# Patient Record
Sex: Female | Born: 1980 | Race: White | Hispanic: No | Marital: Married | Smoking: Current every day smoker
Health system: Southern US, Community
[De-identification: ages and names within clinical notes are randomized; demographics above are authoritative.]

## PROBLEM LIST (undated history)

## (undated) DIAGNOSIS — E559 Vitamin D deficiency, unspecified: Secondary | ICD-10-CM

## (undated) DIAGNOSIS — S060XAA Concussion with loss of consciousness status unknown, initial encounter: Secondary | ICD-10-CM

## (undated) DIAGNOSIS — D259 Leiomyoma of uterus, unspecified: Secondary | ICD-10-CM

## (undated) DIAGNOSIS — G709 Myoneural disorder, unspecified: Secondary | ICD-10-CM

## (undated) DIAGNOSIS — K219 Gastro-esophageal reflux disease without esophagitis: Secondary | ICD-10-CM

## (undated) DIAGNOSIS — O139 Gestational [pregnancy-induced] hypertension without significant proteinuria, unspecified trimester: Secondary | ICD-10-CM

## (undated) DIAGNOSIS — IMO0002 Reserved for concepts with insufficient information to code with codable children: Secondary | ICD-10-CM

## (undated) DIAGNOSIS — M1990 Unspecified osteoarthritis, unspecified site: Secondary | ICD-10-CM

## (undated) DIAGNOSIS — G43909 Migraine, unspecified, not intractable, without status migrainosus: Secondary | ICD-10-CM

## (undated) DIAGNOSIS — E039 Hypothyroidism, unspecified: Secondary | ICD-10-CM

## (undated) HISTORY — PX: APPENDECTOMY: SHX54

## (undated) HISTORY — PX: WISDOM TOOTH EXTRACTION: SHX21

---

## 2001-05-30 ENCOUNTER — Other Ambulatory Visit: Admission: RE | Admit: 2001-05-30 | Discharge: 2001-05-30 | Payer: Self-pay | Admitting: *Deleted

## 2002-06-16 ENCOUNTER — Other Ambulatory Visit: Admission: RE | Admit: 2002-06-16 | Discharge: 2002-06-16 | Payer: Self-pay | Admitting: *Deleted

## 2003-06-22 ENCOUNTER — Other Ambulatory Visit: Admission: RE | Admit: 2003-06-22 | Discharge: 2003-06-22 | Payer: Self-pay | Admitting: *Deleted

## 2003-10-15 ENCOUNTER — Ambulatory Visit (HOSPITAL_COMMUNITY): Admission: RE | Admit: 2003-10-15 | Discharge: 2003-10-15 | Payer: Self-pay | Admitting: Internal Medicine

## 2004-01-17 ENCOUNTER — Encounter: Payer: Self-pay | Admitting: Orthopedic Surgery

## 2005-11-05 ENCOUNTER — Ambulatory Visit: Payer: Self-pay | Admitting: Orthopedic Surgery

## 2005-11-16 ENCOUNTER — Encounter: Payer: Self-pay | Admitting: Orthopedic Surgery

## 2005-11-21 ENCOUNTER — Ambulatory Visit: Payer: Self-pay | Admitting: Orthopedic Surgery

## 2005-12-06 ENCOUNTER — Encounter: Admission: RE | Admit: 2005-12-06 | Discharge: 2005-12-06 | Payer: Self-pay | Admitting: Orthopedic Surgery

## 2005-12-19 ENCOUNTER — Encounter: Admission: RE | Admit: 2005-12-19 | Discharge: 2005-12-19 | Payer: Self-pay | Admitting: Orthopedic Surgery

## 2006-09-09 ENCOUNTER — Ambulatory Visit: Payer: Self-pay | Admitting: Orthopedic Surgery

## 2009-08-29 ENCOUNTER — Ambulatory Visit: Payer: Self-pay | Admitting: Orthopedic Surgery

## 2009-08-29 DIAGNOSIS — M5430 Sciatica, unspecified side: Secondary | ICD-10-CM

## 2009-08-30 ENCOUNTER — Telehealth: Payer: Self-pay | Admitting: Orthopedic Surgery

## 2010-04-08 ENCOUNTER — Emergency Department (HOSPITAL_COMMUNITY): Admission: EM | Admit: 2010-04-08 | Discharge: 2010-04-09 | Payer: Self-pay | Admitting: Emergency Medicine

## 2010-09-17 ENCOUNTER — Encounter: Payer: Self-pay | Admitting: Orthopedic Surgery

## 2010-09-26 NOTE — Progress Notes (Signed)
Summary: MRI on hold.  Phone Note Outgoing Call   Call placed by: Waldon Reining,  August 30, 2009 10:43 AM Call placed to: Patient Action Taken: Phone Call Completed Summary of Call: I called to give patient her MRI appointment and she wanted to wait until she took her Dose pack to see if it helps. She is going to call back before the end of the month if she decides to have the MRI. Patient has [employer], authorization [phone] and it expires in 30 days.

## 2010-09-26 NOTE — Progress Notes (Signed)
Summary: Progress note  Progress note   Imported By: Jacklynn Ganong 08/29/2009 14:46:53  _____________________________________________________________________  External Attachment:    Type:   Image     Comment:   External Document

## 2010-09-26 NOTE — Assessment & Plan Note (Signed)
Summary: lbp needs xrays, esi in past wants new order for esi/bcbs.cbt   Visit Type:  Follow-up  CC:  back pain .  History of Present Illness: I saw Lisa Wolf in the office today for an initial visit.  She is a 30 years old woman with the complaint of:  BACK PAIN.  History of ESI's, which helped.  chief complaint: BACK PAIN.  30 year old female with history of lumbar disc disease, requiring epidural injections. Presents now with back pain for 3 weeks worse by moving bending improve with ibuprofen.  Over the last 3 weeks radiating pain in the RIGHT leg into the RIGHT thigh. Possibly pull the muscle.  note she has had 3 episodes of the back "going out on her" especially with twisting maneuvers and bending maneuvers  -severity [1-10] 8 at worse, today around 3 the pain is intermittent. There are no red flags.   x-rays done in 2007. 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 Had an epidural with good relief.  review of systems neuro negative review of systems musculoskeletal as stated. No muscle weakness, genitourinary, negative.  Physical examination well-developed, well-nourished, female, woman, and hygiene, are intact. Body habitus, thin muscular.  She is oriented x3.  Her mood and affect are normal.  Her gait and station are normal.  Her skin is normal. Over the lumbar area. She is normal pulse, and temperature, and both limbs with no edema, or swelling. Is no lymphadenopathy in either limb. Sensation is normal in each limb and reflexes are normal. At the knee and ankle of both sides, and they are equal, coordination and balance are normal.  She is having some mild tenderness in the midportion of the lumbar spine at the L5 area. She has good range of motion in the spine, flexion, extension.  Strength in her lower extremities normal. Hips are stable. Knees are stable.  Straight leg  raise is mildly positive on the RIGHT, negative on the LEFT.  Assessment recurrent back pain with radicular symptoms in the L3 region. Plan recommend MRI, oral dose pack, oral, Neurontin  Allergies (verified): 1)  ! Cipro  Past History:  Past Medical History: Migraines Back pain  Knee pain.  Review of Systems General:  Denies weight loss, weight gain, fever, chills, and fatigue. Cardiac :  Denies chest pain, angina, heart attack, heart failure, poor circulation, blood clots, and phlebitis. Resp:  Denies short of breath, difficulty breathing, COPD, cough, and pneumonia. GI:  Denies nausea, vomiting, diarrhea, constipation, difficulty swallowing, ulcers, GERD, and reflux. GU:  Denies kidney failure, kidney transplant, kidney stones, burning, poor stream, testicular cancer, blood in urine, and . Neuro:  Complains of migraines; denies headache, dizziness, numbness, weakness, tremor, and unsteady walking. MS:  Complains of joint pain; denies rheumatoid arthritis, joint swelling, gout, bone cancer, osteoporosis, and . Endo:  Denies thyroid disease, goiter, and diabetes. Psych:  Denies depression, mood swings, anxiety, panic attack, bipolar, and schizophrenia. Derm:  Denies eczema, cancer, and itching. EENT:  Denies poor vision, cataracts, glaucoma, poor hearing, vertigo, ears ringing, sinusitis, hoarseness, toothaches, and bleeding gums. Immunology:  Denies seasonal allergies, sinus problems, and allergic to bee stings. Lymphatic:  Denies lymph node cancer and lymph edema.   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 Focal disc protrusion at L4-L5, deviating and compressing the LEFT L5 nerve root in the  lateral recess facet arthritis as well. Moderate facet arthritic changes at L2 and 3, moderately advanced facet arthritis, and disc bulging at L4, and 3, advanced arthritis at L4, and 5 central  disc protrusion as well.  The patient improved no need to get epidural. If he worsens, she will call us, and we'll go ahead and schedule. Epidural.  Orders: Est. Patient Level III ([address])  Medications Added to Medication List This Visit: 1)  Neurontin 300 Mg Caps (Gabapentin) .... One by mouth hs 2)  Prednisone (pak) 10 Mg Tabs (Prednisone) .... As directed for 12 days  Patient Instructions: 1)  go for MRI we will call you and let you know when to go 2)  take meds as directed 3)  call us if your pain gets worse and we will send you for an ESI. 4)  We will call you with results. Prescriptions: PREDNISONE (PAK) 10 MG TABS (PREDNISONE) as directed for 12 days  #1 x 1   Entered and Authorized by:   Fuller Canada MD   Signed by:   Fuller Canada MD on 08/29/2009   Method used:   Print then Give to Patient   RxID:   [id number] NEURONTIN 300 MG CAPS (GABAPENTIN) one by mouth hs  #30 x 1   Entered and Authorized by:   Fuller Canada MD   Signed by:   Fuller Canada MD on 08/29/2009   Method used:   Print then Give to Patient   RxID:   [id number]

## 2010-09-26 NOTE — Progress Notes (Signed)
Summary: Initial evaluation  Initial evaluation   Imported By: Jacklynn Ganong 08/29/2009 14:47:50  _____________________________________________________________________  External Attachment:    Type:   Image     Comment:   External Document

## 2010-09-26 NOTE — Letter (Signed)
Summary: History form  History form   Imported By: Jacklynn Ganong 08/30/2009 14:37:25  _____________________________________________________________________  External Attachment:    Type:   Image     Comment:   External Document

## 2011-08-28 DIAGNOSIS — Z8759 Personal history of other complications of pregnancy, childbirth and the puerperium: Secondary | ICD-10-CM

## 2011-08-28 HISTORY — DX: Personal history of other complications of pregnancy, childbirth and the puerperium: Z87.59

## 2011-11-13 LAB — OB RESULTS CONSOLE RUBELLA ANTIBODY, IGM: Rubella: IMMUNE

## 2011-11-13 LAB — OB RESULTS CONSOLE HIV ANTIBODY (ROUTINE TESTING): HIV: NONREACTIVE

## 2011-11-27 LAB — OB RESULTS CONSOLE GC/CHLAMYDIA
Chlamydia: NEGATIVE
Gonorrhea: NEGATIVE

## 2012-05-23 LAB — OB RESULTS CONSOLE GBS: GBS: NEGATIVE

## 2012-06-24 ENCOUNTER — Encounter (HOSPITAL_COMMUNITY)
Admission: AD | Disposition: A | Discharge status: Home / Self Care | Payer: Self-pay | Source: Ambulatory Visit | Attending: Obstetrics and Gynecology

## 2012-06-24 ENCOUNTER — Inpatient Hospital Stay (HOSPITAL_COMMUNITY)
Admission: AD | Admit: 2012-06-24 | Discharge: 2012-06-27 | DRG: 370 | Disposition: A | Discharge status: Home / Self Care | Payer: BC Managed Care – PPO | Source: Ambulatory Visit | Attending: Obstetrics and Gynecology | Admitting: Obstetrics and Gynecology

## 2012-06-24 ENCOUNTER — Encounter (HOSPITAL_COMMUNITY): Payer: Self-pay | Admitting: Anesthesiology

## 2012-06-24 ENCOUNTER — Encounter (HOSPITAL_COMMUNITY): Payer: Self-pay | Admitting: *Deleted

## 2012-06-24 ENCOUNTER — Inpatient Hospital Stay (HOSPITAL_COMMUNITY): Payer: BC Managed Care – PPO | Admitting: Anesthesiology

## 2012-06-24 DIAGNOSIS — D62 Acute posthemorrhagic anemia: Secondary | ICD-10-CM | POA: Diagnosis not present

## 2012-06-24 DIAGNOSIS — O139 Gestational [pregnancy-induced] hypertension without significant proteinuria, unspecified trimester: Secondary | ICD-10-CM | POA: Diagnosis present

## 2012-06-24 DIAGNOSIS — O4100X0 Oligohydramnios, unspecified trimester, not applicable or unspecified: Secondary | ICD-10-CM | POA: Diagnosis present

## 2012-06-24 DIAGNOSIS — O9903 Anemia complicating the puerperium: Secondary | ICD-10-CM | POA: Diagnosis not present

## 2012-06-24 HISTORY — DX: Gestational (pregnancy-induced) hypertension without significant proteinuria, unspecified trimester: O13.9

## 2012-06-24 HISTORY — DX: Reserved for concepts with insufficient information to code with codable children: IMO0002

## 2012-06-24 LAB — COMPREHENSIVE METABOLIC PANEL
ALT: 13 U/L (ref 0–35)
AST: 16 U/L (ref 0–37)
Albumin: 2.5 g/dL — ABNORMAL LOW (ref 3.5–5.2)
Calcium: 8.9 mg/dL (ref 8.4–10.5)
Sodium: 133 mEq/L — ABNORMAL LOW (ref 135–145)
Total Protein: 6.1 g/dL (ref 6.0–8.3)

## 2012-06-24 LAB — RPR: RPR Ser Ql: NONREACTIVE

## 2012-06-24 LAB — CBC
Hemoglobin: 11.3 g/dL — ABNORMAL LOW (ref 12.0–15.0)
MCHC: 32.8 g/dL (ref 30.0–36.0)
Platelets: 279 10*3/uL (ref 150–400)
RDW: 13.7 % (ref 11.5–15.5)

## 2012-06-24 SURGERY — Surgical Case
Anesthesia: Spinal | Site: Abdomen | Wound class: Clean Contaminated

## 2012-06-24 MED ORDER — SCOPOLAMINE 1 MG/3DAYS TD PT72
MEDICATED_PATCH | TRANSDERMAL | Status: AC
  Filled 2012-06-24: qty 1

## 2012-06-24 MED ORDER — SODIUM CHLORIDE 0.9 % IJ SOLN: 3.0000 mL | INTRAMUSCULAR | Status: DC | PRN

## 2012-06-24 MED ORDER — LIDOCAINE HCL (PF) 1 % IJ SOLN: 30.0000 mL | INTRAMUSCULAR | Status: DC | PRN

## 2012-06-24 MED ORDER — MORPHINE SULFATE (PF) 0.5 MG/ML IJ SOLN
INTRAMUSCULAR | Status: DC | PRN
  Administered 2012-06-24: .1 ug via INTRATHECAL

## 2012-06-24 MED ORDER — EPHEDRINE 5 MG/ML INJ
INTRAVENOUS | Status: AC
  Filled 2012-06-24: qty 10

## 2012-06-24 MED ORDER — WITCH HAZEL-GLYCERIN EX PADS: 1.0000 "application " | MEDICATED_PAD | CUTANEOUS | Status: DC | PRN

## 2012-06-24 MED ORDER — BUPIVACAINE HCL (PF) 0.25 % IJ SOLN
INTRAMUSCULAR | Status: AC
  Filled 2012-06-24: qty 30

## 2012-06-24 MED ORDER — LANOLIN HYDROUS EX OINT: 1.0000 "application " | TOPICAL_OINTMENT | CUTANEOUS | Status: DC | PRN

## 2012-06-24 MED ORDER — KETOROLAC TROMETHAMINE 30 MG/ML IJ SOLN
30.0000 mg | Freq: Four times a day (QID) | INTRAMUSCULAR | Status: DC | PRN
  Administered 2012-06-25: 30 mg via INTRAVENOUS
  Filled 2012-06-24: qty 1

## 2012-06-24 MED ORDER — SIMETHICONE 80 MG PO CHEW
80.0000 mg | CHEWABLE_TABLET | Freq: Three times a day (TID) | ORAL | Status: DC
  Administered 2012-06-25 – 2012-06-27 (×9): 80 mg via ORAL

## 2012-06-24 MED ORDER — OXYTOCIN BOLUS FROM INFUSION
500.0000 mL | INTRAVENOUS | Status: DC
  Filled 2012-06-24 (×58): qty 500

## 2012-06-24 MED ORDER — KETOROLAC TROMETHAMINE 30 MG/ML IJ SOLN: 30.0000 mg | Freq: Four times a day (QID) | INTRAMUSCULAR | Status: DC | PRN

## 2012-06-24 MED ORDER — LACTATED RINGERS IV SOLN
INTRAVENOUS | Status: DC
  Administered 2012-06-24 (×2): 125 mL/h via INTRAVENOUS

## 2012-06-24 MED ORDER — METOCLOPRAMIDE HCL 5 MG/ML IJ SOLN: 10.0000 mg | Freq: Three times a day (TID) | INTRAMUSCULAR | Status: DC | PRN

## 2012-06-24 MED ORDER — OXYTOCIN 10 UNIT/ML IJ SOLN
40.0000 [IU] | INTRAVENOUS | Status: DC | PRN
  Administered 2012-06-24: 40 [IU] via INTRAVENOUS

## 2012-06-24 MED ORDER — SENNOSIDES-DOCUSATE SODIUM 8.6-50 MG PO TABS
2.0000 | ORAL_TABLET | Freq: Every day | ORAL | Status: DC
  Administered 2012-06-25 – 2012-06-26 (×2): 2 via ORAL

## 2012-06-24 MED ORDER — CEFAZOLIN SODIUM-DEXTROSE 2-3 GM-% IV SOLR
INTRAVENOUS | Status: DC | PRN
  Administered 2012-06-24: 2 g via INTRAVENOUS

## 2012-06-24 MED ORDER — ACETAMINOPHEN 325 MG PO TABS: 650.0000 mg | ORAL_TABLET | ORAL | Status: DC | PRN

## 2012-06-24 MED ORDER — OXYCODONE-ACETAMINOPHEN 5-325 MG PO TABS
1.0000 | ORAL_TABLET | ORAL | Status: DC | PRN
  Administered 2012-06-25 – 2012-06-27 (×10): 1 via ORAL
  Filled 2012-06-24 (×2): qty 1
  Filled 2012-06-24: qty 2
  Filled 2012-06-24 (×6): qty 1

## 2012-06-24 MED ORDER — ONDANSETRON HCL 4 MG/2ML IJ SOLN
INTRAMUSCULAR | Status: AC
  Filled 2012-06-24: qty 2

## 2012-06-24 MED ORDER — SODIUM CHLORIDE 0.9 % IV SOLN
1.0000 ug/kg/h | INTRAVENOUS | Status: DC | PRN
  Filled 2012-06-24: qty 2.5

## 2012-06-24 MED ORDER — MORPHINE SULFATE 0.5 MG/ML IJ SOLN
INTRAMUSCULAR | Status: AC
  Filled 2012-06-24: qty 10

## 2012-06-24 MED ORDER — FENTANYL CITRATE 0.05 MG/ML IJ SOLN
INTRAMUSCULAR | Status: DC | PRN
  Administered 2012-06-24: 15 ug via INTRATHECAL

## 2012-06-24 MED ORDER — BUPIVACAINE IN DEXTROSE 0.75-8.25 % IT SOLN
INTRATHECAL | Status: DC | PRN
  Administered 2012-06-24: 1.5 mL via INTRATHECAL

## 2012-06-24 MED ORDER — NALBUPHINE SYRINGE 5 MG/0.5 ML
5.0000 mg | INJECTION | INTRAMUSCULAR | Status: DC | PRN
  Administered 2012-06-24: 10 mg via SUBCUTANEOUS
  Filled 2012-06-24 (×2): qty 1

## 2012-06-24 MED ORDER — MENTHOL 3 MG MT LOZG: 1.0000 | LOZENGE | OROMUCOSAL | Status: DC | PRN

## 2012-06-24 MED ORDER — NALOXONE HCL 0.4 MG/ML IJ SOLN: 0.4000 mg | INTRAMUSCULAR | Status: DC | PRN

## 2012-06-24 MED ORDER — OXYTOCIN 40 UNITS IN LACTATED RINGERS INFUSION - SIMPLE MED: 62.5000 mL/h | INTRAVENOUS | Status: DC

## 2012-06-24 MED ORDER — TERBUTALINE SULFATE 1 MG/ML IJ SOLN: 0.2500 mg | Freq: Once | INTRAMUSCULAR | Status: DC | PRN

## 2012-06-24 MED ORDER — EPHEDRINE SULFATE 50 MG/ML IJ SOLN
INTRAMUSCULAR | Status: DC | PRN
  Administered 2012-06-24: 10 mg via INTRAVENOUS

## 2012-06-24 MED ORDER — IBUPROFEN 600 MG PO TABS: 600.0000 mg | ORAL_TABLET | Freq: Four times a day (QID) | ORAL | Status: DC

## 2012-06-24 MED ORDER — FENTANYL CITRATE 0.05 MG/ML IJ SOLN: 25.0000 ug | INTRAMUSCULAR | Status: DC | PRN

## 2012-06-24 MED ORDER — FLEET ENEMA 7-19 GM/118ML RE ENEM: 1.0000 | ENEMA | RECTAL | Status: DC | PRN

## 2012-06-24 MED ORDER — LACTATED RINGERS IV SOLN
INTRAVENOUS | Status: DC | PRN
  Administered 2012-06-24 (×2): via INTRAVENOUS

## 2012-06-24 MED ORDER — LACTATED RINGERS IV SOLN
INTRAVENOUS | Status: DC
  Administered 2012-06-25 (×2): via INTRAVENOUS

## 2012-06-24 MED ORDER — METHYLERGONOVINE MALEATE 0.2 MG PO TABS: 0.2000 mg | ORAL_TABLET | ORAL | Status: DC | PRN

## 2012-06-24 MED ORDER — BUPIVACAINE HCL (PF) 0.25 % IJ SOLN
INTRAMUSCULAR | Status: DC | PRN
  Administered 2012-06-24: 10 mL

## 2012-06-24 MED ORDER — ZOLPIDEM TARTRATE 5 MG PO TABS: 5.0000 mg | ORAL_TABLET | Freq: Every evening | ORAL | Status: DC | PRN

## 2012-06-24 MED ORDER — CITRIC ACID-SODIUM CITRATE 334-500 MG/5ML PO SOLN
30.0000 mL | ORAL | Status: DC | PRN
  Administered 2012-06-24: 30 mL via ORAL
  Filled 2012-06-24: qty 15

## 2012-06-24 MED ORDER — MISOPROSTOL 25 MCG QUARTER TABLET
25.0000 ug | ORAL_TABLET | ORAL | Status: DC | PRN
  Administered 2012-06-24 (×2): 25 ug via VAGINAL
  Filled 2012-06-24 (×2): qty 0.25

## 2012-06-24 MED ORDER — KETOROLAC TROMETHAMINE 60 MG/2ML IM SOLN
INTRAMUSCULAR | Status: AC
  Filled 2012-06-24: qty 2

## 2012-06-24 MED ORDER — ONDANSETRON HCL 4 MG/2ML IJ SOLN: 4.0000 mg | Freq: Four times a day (QID) | INTRAMUSCULAR | Status: DC | PRN

## 2012-06-24 MED ORDER — PRENATAL MULTIVITAMIN CH
1.0000 | ORAL_TABLET | Freq: Every day | ORAL | Status: DC
  Administered 2012-06-25 – 2012-06-27 (×3): 1 via ORAL
  Filled 2012-06-24 (×3): qty 1

## 2012-06-24 MED ORDER — ONDANSETRON HCL 4 MG/2ML IJ SOLN
INTRAMUSCULAR | Status: DC | PRN
  Administered 2012-06-24: 4 mg via INTRAVENOUS

## 2012-06-24 MED ORDER — FENTANYL CITRATE 0.05 MG/ML IJ SOLN
INTRAMUSCULAR | Status: AC
  Filled 2012-06-24: qty 2

## 2012-06-24 MED ORDER — IBUPROFEN 600 MG PO TABS
600.0000 mg | ORAL_TABLET | Freq: Four times a day (QID) | ORAL | Status: DC | PRN
  Administered 2012-06-25 – 2012-06-27 (×10): 600 mg via ORAL
  Filled 2012-06-24 (×10): qty 1

## 2012-06-24 MED ORDER — OXYCODONE-ACETAMINOPHEN 5-325 MG PO TABS: 1.0000 | ORAL_TABLET | ORAL | Status: DC | PRN

## 2012-06-24 MED ORDER — MEPERIDINE HCL 25 MG/ML IJ SOLN: 6.2500 mg | INTRAMUSCULAR | Status: DC | PRN

## 2012-06-24 MED ORDER — DIPHENHYDRAMINE HCL 50 MG/ML IJ SOLN: 12.5000 mg | INTRAMUSCULAR | Status: DC | PRN

## 2012-06-24 MED ORDER — ONDANSETRON HCL 4 MG PO TABS: 4.0000 mg | ORAL_TABLET | ORAL | Status: DC | PRN

## 2012-06-24 MED ORDER — KETOROLAC TROMETHAMINE 60 MG/2ML IM SOLN
60.0000 mg | Freq: Once | INTRAMUSCULAR | Status: AC | PRN
  Administered 2012-06-24: 60 mg via INTRAMUSCULAR

## 2012-06-24 MED ORDER — DIPHENHYDRAMINE HCL 50 MG/ML IJ SOLN: 25.0000 mg | INTRAMUSCULAR | Status: DC | PRN

## 2012-06-24 MED ORDER — DIBUCAINE 1 % RE OINT: 1.0000 "application " | TOPICAL_OINTMENT | RECTAL | Status: DC | PRN

## 2012-06-24 MED ORDER — CEFAZOLIN SODIUM-DEXTROSE 2-3 GM-% IV SOLR: 2.0000 g | INTRAVENOUS | Status: DC

## 2012-06-24 MED ORDER — LACTATED RINGERS IV SOLN
INTRAVENOUS | Status: DC | PRN
  Administered 2012-06-24: 19:00:00 via INTRAVENOUS

## 2012-06-24 MED ORDER — ONDANSETRON HCL 4 MG/2ML IJ SOLN: 4.0000 mg | Freq: Three times a day (TID) | INTRAMUSCULAR | Status: DC | PRN

## 2012-06-24 MED ORDER — CEFAZOLIN SODIUM-DEXTROSE 2-3 GM-% IV SOLR
INTRAVENOUS | Status: AC
  Filled 2012-06-24: qty 50

## 2012-06-24 MED ORDER — DIPHENHYDRAMINE HCL 25 MG PO CAPS: 25.0000 mg | ORAL_CAPSULE | Freq: Four times a day (QID) | ORAL | Status: DC | PRN

## 2012-06-24 MED ORDER — NALBUPHINE SYRINGE 5 MG/0.5 ML
5.0000 mg | INJECTION | INTRAMUSCULAR | Status: DC | PRN
  Filled 2012-06-24: qty 1

## 2012-06-24 MED ORDER — SIMETHICONE 80 MG PO CHEW: 80.0000 mg | CHEWABLE_TABLET | ORAL | Status: DC | PRN

## 2012-06-24 MED ORDER — OXYTOCIN 40 UNITS IN LACTATED RINGERS INFUSION - SIMPLE MED: 62.5000 mL/h | INTRAVENOUS | Status: AC

## 2012-06-24 MED ORDER — TETANUS-DIPHTH-ACELL PERTUSSIS 5-2.5-18.5 LF-MCG/0.5 IM SUSP
0.5000 mL | Freq: Once | INTRAMUSCULAR | Status: AC
  Administered 2012-06-25: 0.5 mL via INTRAMUSCULAR
  Filled 2012-06-24: qty 0.5

## 2012-06-24 MED ORDER — LACTATED RINGERS IV SOLN
500.0000 mL | INTRAVENOUS | Status: DC | PRN
  Administered 2012-06-24: 500 mL via INTRAVENOUS

## 2012-06-24 MED ORDER — METHYLERGONOVINE MALEATE 0.2 MG/ML IJ SOLN: 0.2000 mg | INTRAMUSCULAR | Status: DC | PRN

## 2012-06-24 MED ORDER — ONDANSETRON HCL 4 MG/2ML IJ SOLN: 4.0000 mg | INTRAMUSCULAR | Status: DC | PRN

## 2012-06-24 MED ORDER — DIPHENHYDRAMINE HCL 25 MG PO CAPS: 25.0000 mg | ORAL_CAPSULE | ORAL | Status: DC | PRN

## 2012-06-24 MED ORDER — SCOPOLAMINE 1 MG/3DAYS TD PT72
1.0000 | MEDICATED_PATCH | Freq: Once | TRANSDERMAL | Status: AC
  Administered 2012-06-24: 1.5 mg via TRANSDERMAL

## 2012-06-24 MED ORDER — IBUPROFEN 600 MG PO TABS: 600.0000 mg | ORAL_TABLET | Freq: Four times a day (QID) | ORAL | Status: DC | PRN

## 2012-06-24 MED ORDER — OXYTOCIN 10 UNIT/ML IJ SOLN
INTRAMUSCULAR | Status: AC
  Filled 2012-06-24: qty 4

## 2012-06-24 SURGICAL SUPPLY — 33 items
CLOTH BEACON ORANGE TIMEOUT ST (SAFETY) ×2 IMPLANT
CONTAINER PREFILL 10% NBF 15ML (MISCELLANEOUS) IMPLANT
DRAPE SURG 17X23 STRL (DRAPES) ×2 IMPLANT
DRESSING TELFA 8X3 (GAUZE/BANDAGES/DRESSINGS) IMPLANT
DRSG COVADERM 4X10 (GAUZE/BANDAGES/DRESSINGS) ×1 IMPLANT
DURAPREP 26ML APPLICATOR (WOUND CARE) ×1 IMPLANT
ELECT REM PT RETURN 9FT ADLT (ELECTROSURGICAL) ×2
ELECTRODE REM PT RTRN 9FT ADLT (ELECTROSURGICAL) ×1 IMPLANT
EXTRACTOR VACUUM M CUP 4 TUBE (SUCTIONS) IMPLANT
GAUZE SPONGE 4X4 12PLY STRL LF (GAUZE/BANDAGES/DRESSINGS) ×2 IMPLANT
GLOVE BIO SURGEON STRL SZ7.5 (GLOVE) ×4 IMPLANT
GOWN PREVENTION PLUS LG XLONG (DISPOSABLE) ×3 IMPLANT
GOWN PREVENTION PLUS XLARGE (GOWN DISPOSABLE) ×2 IMPLANT
KIT ABG SYR 3ML LUER SLIP (SYRINGE) ×1 IMPLANT
NDL HYPO 25X1 1.5 SAFETY (NEEDLE) ×1 IMPLANT
NEEDLE HYPO 25X1 1.5 SAFETY (NEEDLE) ×2 IMPLANT
NEEDLE HYPO 25X5/8 SAFETYGLIDE (NEEDLE) ×2 IMPLANT
NS IRRIG 1000ML POUR BTL (IV SOLUTION) ×2 IMPLANT
PACK C SECTION WH (CUSTOM PROCEDURE TRAY) ×2 IMPLANT
PAD ABD 7.5X8 STRL (GAUZE/BANDAGES/DRESSINGS) IMPLANT
PAD OB MATERNITY 4.3X12.25 (PERSONAL CARE ITEMS) IMPLANT
SLEEVE SCD COMPRESS KNEE MED (MISCELLANEOUS) IMPLANT
STAPLER VISISTAT 35W (STAPLE) ×2 IMPLANT
SUT MNCRL 0 VIOLET CTX 36 (SUTURE) ×2 IMPLANT
SUT MON AB 2-0 CT1 27 (SUTURE) ×2 IMPLANT
SUT MON AB-0 CT1 36 (SUTURE) ×4 IMPLANT
SUT MONOCRYL 0 CTX 36 (SUTURE) ×2
SUT PLAIN 0 NONE (SUTURE) IMPLANT
SUT PLAIN 2 0 XLH (SUTURE) IMPLANT
SYR CONTROL 10ML LL (SYRINGE) ×2 IMPLANT
TOWEL OR 17X24 6PK STRL BLUE (TOWEL DISPOSABLE) ×4 IMPLANT
TRAY FOLEY CATH 14FR (SET/KITS/TRAYS/PACK) ×2 IMPLANT
WATER STERILE IRR 1000ML POUR (IV SOLUTION) IMPLANT

## 2012-06-24 NOTE — Progress Notes (Signed)
Lisa Wolf is a 31 y.o. G1P0000 at [redacted]w[redacted]d by LMP admitted for induction of labor due to Low amniotic fluid. and Hypertension.  Subjective: comfortable  Objective: BP 139/73  Pulse 78  Temp 98.3 F (36.8 C) (Oral)  Resp 18  Ht 5\' 6"  (1.676 m)  Wt 98.884 kg (218 lb)  BMI 35.19 kg/m2  LMP 09/17/2011      FHT:  FHR: 155 bpm, variability: moderate,  accelerations:  Present,  decelerations:  Absent and   UC:   irregular, every 10  minutes SVE:   Dilation: 1.5 Effacement (%): 60 Station: -1;-2 Exam by:: S Grindstaff RN  Labs: Lab Results  Component Value Date   WBC 8.1 06/24/2012   HGB 11.3* 06/24/2012   HCT 34.4* 06/24/2012   MCV 85.4 06/24/2012   PLT 279 06/24/2012    Assessment / Plan: Gestational HTN Borderline AFI  Labor: Progressing normally Preeclampsia:  labs pending Fetal Wellbeing:  Category I Pain Control:  Labor support without medications I/D:  n/a Anticipated MOD:  trial of labor  Lisa Wolf J 06/24/2012, 2:00 PM

## 2012-06-24 NOTE — Progress Notes (Signed)
Dr Rodman Pickle notified Dr Billy Coast calling C/S for non-reassuring FHR.  Dr Billy Coast in room from 1733 to present evaluating FHR

## 2012-06-24 NOTE — Transfer of Care (Signed)
Immediate Anesthesia Transfer of Care Note  Patient: Lisa Wolf  Procedure(s) Performed: Procedure(s) (LRB) with comments: CESAREAN SECTION (N/A)  Patient Location: PACU  Anesthesia Type:Regional  Level of Consciousness: awake  Airway & Oxygen Therapy: Patient Spontanous Breathing  Post-op Assessment: Report given to PACU RN  Post vital signs: Reviewed and stable  Complications: No apparent anesthesia complications

## 2012-06-24 NOTE — Anesthesia Postprocedure Evaluation (Signed)
Anesthesia Post Note  Patient: Lisa Wolf  Procedure(s) Performed: Procedure(s) (LRB): CESAREAN SECTION (N/A)  Anesthesia type: Spinal  Patient location: PACU  Post pain: Pain level controlled  Post assessment: Post-op Vital signs reviewed  Last Vitals:  Filed Vitals:   06/24/12 1945  BP: 124/62  Pulse: 66  Temp:   Resp: 23    Post vital signs: Reviewed  Level of consciousness: awake  Complications: No apparent anesthesia complications

## 2012-06-24 NOTE — H&P (Signed)
Lisa Wolf, Lisa Wolf                ACCOUNT NO.:  [id number]  MEDICAL RECORD NO.:  [id number]  LOCATION:  9162                          FACILITY:  [facility]  PHYSICIAN:  Lenoard Aden, M.D.DATE OF BIRTH:  [birth year]  DATE OF ADMISSION:  06/24/2012 DATE OF DISCHARGE:                             HISTORY & PHYSICAL   CHIEF COMPLAINT:  Gestational hypertension and borderline oligohydramnios for induction.  She is a 31 year old white female, G1, P0, at 58 and 1/7th week gestation with a borderline AFI between 5 and 8, and gestational hypertension, reproducible blood pressure is 140/90 or greater, who presents now for induction of labor at 40 weeks and 1 day.  ALLERGIES:  She has allergies to CIPRO.  MEDICATIONS:  Prenatal vitamins.  SOCIAL HISTORY:  She is a nonsmoker, nondrinker.  She denies domestic or physical violence.  FAMILY HISTORY:  She has a family history of colon cancer, COPD, heart disease, diabetes, and congestive heart failure.  MEDICAL HISTORY:  She had a personal medical history of migraine headaches, and abnormal Pap smear requiring colposcopy.  Her prenatal course was complicated as noted.  PHYSICAL EXAMINATION:  GENERAL:  She is a well-developed, well- nourished, white female, in no acute distress. HEENT:  Normal. NECK:  Supple.  Full range of motion. LUNGS:  Clear. HEART:  Regular rhythm. ABDOMEN:  Soft, gravid, and nontender.  Estimated fetal weight on ultrasound 8.5 to 9 pounds.  Cervix is 1-2 cm, 50%, vertex, -1, -2. EXTREMITIES:  There are no cords. NEUROLOGIC:  Nonfocal. SKIN:  Intact.  IMPRESSION: 1. A 40 week intrauterine pregnancy. 2. Borderline oligohydramnios. 3. Gestational hypertension.  PLAN:  Admit CBC and PIH panel ordered.  Cytotec placed.  Anticipate cautious approach of vaginal delivery with close observation of blood pressure and signs and symptoms of preeclampsia.     Lenoard Aden, M.D.     RJT/MEDQ  D:  06/24/2012   T:  06/24/2012  Job:  [id number]

## 2012-06-24 NOTE — Progress Notes (Signed)
Pt returned from using bathroom

## 2012-06-24 NOTE — Anesthesia Preprocedure Evaluation (Signed)
Anesthesia Evaluation  Patient identified by MRN, date of birth, ID band Patient awake    Reviewed: Allergy & Precautions, H&P , NPO status , Patient's Chart, lab work & pertinent test results, reviewed documented beta blocker date and time   History of Anesthesia Complications Negative for: history of anesthetic complications  Airway Mallampati: III TM Distance: >3 FB Neck ROM: full    Dental  (+) Teeth Intact   Pulmonary neg pulmonary ROS,  breath sounds clear to auscultation        Cardiovascular hypertension (PIH), Rhythm:regular Rate:Normal     Neuro/Psych negative neurological ROS  negative psych ROS   GI/Hepatic negative GI ROS, Neg liver ROS,   Endo/Other  negative endocrine ROS  Renal/GU negative Renal ROS     Musculoskeletal   Abdominal   Peds  Hematology negative hematology ROS (+)   Anesthesia Other Findings Ate full meal at 12 pm  Reproductive/Obstetrics (+) Pregnancy (fetal distress)                           Anesthesia Physical Anesthesia Plan  ASA: III and Emergent  Anesthesia Plan: Spinal   Post-op Pain Management:    Induction:   Airway Management Planned:   Additional Equipment:   Intra-op Plan:   Post-operative Plan:   Informed Consent: I have reviewed the patients History and Physical, chart, labs and discussed the procedure including the risks, benefits and alternatives for the proposed anesthesia with the patient or authorized representative who has indicated his/her understanding and acceptance.     Plan Discussed with: Surgeon and CRNA  Anesthesia Plan Comments:         Anesthesia Quick Evaluation

## 2012-06-24 NOTE — Op Note (Signed)
Cesarean Section Procedure Note  Indications: non-reassuring fetal status  Pre-operative Diagnosis: 40 week 1 day pregnancy.  Post-operative Diagnosis: same  Surgeon: Lenoard Aden   Assistants: none  Anesthesia: Local anesthesia 0.25.% bupivacaine and Spinal anesthesia  ASA Class: 2  Procedure Details  The patient was seen in the Holding Room. The risks, benefits, complications, treatment options, and expected outcomes were discussed with the patient.  The patient concurred with the proposed plan, giving informed consent. The risks of anesthesia, infection, bleeding and possible injury to other organs discussed. Injury to bowel, bladder, or ureter with possible need for repair discussed. Possible need for transfusion with secondary risks of hepatitis or HIV acquisition discussed. Post operative complications to include but not limited to DVT, PE and Pneumonia noted. The site of surgery properly noted/marked. The patient was taken to Operating Room # 1, identified as Lisa Wolf and the procedure verified as C-Section Delivery. A Time Out was held and the above information confirmed.  After induction of anesthesia, the patient was draped and prepped in the usual sterile manner. A Pfannenstiel incision was made and carried down through the subcutaneous tissue to the fascia. Fascial incision was made and extended transversely using Mayo scissors. The fascia was separated from the underlying rectus tissue superiorly and inferiorly. The peritoneum was identified and entered. Peritoneal incision was extended longitudinally. The utero-vesical peritoneal reflection was incised transversely and the bladder flap was bluntly freed from the lower uterine segment. A low transverse uterine incision(Kerr hysterotomy) was made. Delivered from OP presentation was a  female with Apgar scores of 6 at one minute and 9 at five minutes. Bulb suctioning gently performed. Neonatal team in attendance.After the  umbilical cord was clamped and cut cord blood was obtained for evaluation. The placenta was removed intact and appeared normal. The uterus was curetted with a dry lap pack. Good hemostasis was noted.The uterine outline, tubes and ovaries appeared normal. Small fundal fibroid noted. The uterine incision was closed with running locked sutures of 0 Monocryl x 2 layers. Hemostasis was observed. Lavage was carried out until clear.The parietal peritoneum was closed with a running 2-0 Monocryl suture. The fascia was then reapproximated with running sutures of 0 Monocryl. Delano tissue closed with 2-0 plain. The skin was reapproximated with staples.  Instrument, sponge, and needle counts were correct prior the abdominal closure and at the conclusion of the case.   Findings: Above Meconium stained fluid  Estimated Blood Loss:  500         Drains: Foley                 Specimens: placenta to path                 Complications:  None; patient tolerated the procedure well.         Disposition: PACU - hemodynamically stable.         Condition: stable  Attending Attestation: I performed the procedure.

## 2012-06-24 NOTE — Progress Notes (Signed)
Lisa Wolf is a 31 y.o. G1P0000 at [redacted]w[redacted]d by LMP admitted for induction of labor due to Low amniotic fluid. and Hypertension.  Subjective: Comfortable  Objective: BP 133/78  Pulse 76  Temp 98.4 F (36.9 C) (Oral)  Resp 16  Ht 5\' 6"  (1.676 m)  Wt 98.884 kg (218 lb)  BMI 35.19 kg/m2  LMP 09/17/2011      FHT:  FHR: 155 bpm, variability: moderate,  accelerations:  Present,  decelerations:  Present occ mild variable UC:   irregular, every 10 minutes SVE:   Dilation: 1.5 Effacement (%): 60 Station: -2;-1 Exam by:: S Grindstaff RN Cytotec placed  Labs: Lab Results  Component Value Date   WBC 8.1 06/24/2012   HGB 11.3* 06/24/2012   HCT 34.4* 06/24/2012   MCV 85.4 06/24/2012   PLT 279 06/24/2012    Assessment / Plan: [redacted] weeks Gestational HTN Borderline AFI  Labor: Progressing normally on Cytotec Preeclampsia:  no signs or symptoms of toxicity and labs stable Fetal Wellbeing:  Category I Pain Control:  Labor support without medications I/D:  n/a Anticipated MOD:  Watch Tracing closely, cautious attempt at VD  Fredderick Swanger J 06/24/2012, 4:15 PM

## 2012-06-24 NOTE — Progress Notes (Signed)
Lisa Wolf is a 31 y.o. G1P0000 at [redacted]w[redacted]d by LMP admitted for induction of labor due to Low amniotic fluid. and Hypertension.  Subjective: Feeling contractions  Objective: BP 135/78  Pulse 71  Temp 98.4 F (36.9 C) (Oral)  Resp 16  Ht 5\' 6"  (1.676 m)  Wt 98.884 kg (218 lb)  BMI 35.19 kg/m2  LMP 09/17/2011      FHT:  FHR: 160-170 bpm, variability: moderate,  accelerations:  Abscent,  decelerations:  Present deep variables with contractions, late decels now with repositioning UC:   regular, every 2 minutes SVE:   Dilation: 2.5 Effacement (%): 70 Station: -1 Exam by:: Dr Billy Coast  Labs: Lab Results  Component Value Date   WBC 8.1 06/24/2012   HGB 11.3* 06/24/2012   HCT 34.4* 06/24/2012   MCV 85.4 06/24/2012   PLT 279 06/24/2012    Assessment / Plan: Gestational HTN Borderline AFI Non Reassuring FHR tracing  Labor: s/p Cytotec Preeclampsia:  no signs or symptoms of toxicity and labs stable Fetal Wellbeing:  Category II and Category III Pain Control:  Labor support without medications I/D:  n/a Anticipated MOD:  Proceed with csection. Risks vs benefits discussed. Given no change in exam and recurrent decels. Recommend csection. Discussed AROM with IUPC and amnioinfusion. Pt and husband declined.OR notified.  Brittian Renaldo J 06/24/2012, 5:58 PM

## 2012-06-24 NOTE — Progress Notes (Signed)
In O.R. 

## 2012-06-24 NOTE — Progress Notes (Signed)
Dr Billy Coast at nursings station after delivery in another room.  MD aware of fhr and variables and nursing interventions

## 2012-06-24 NOTE — Progress Notes (Signed)
To OR

## 2012-06-24 NOTE — Anesthesia Procedure Notes (Signed)
Spinal  Patient location during procedure: OR Start time: 06/24/2012 6:45 PM Staffing Performed by: anesthesiologist  Preanesthetic Checklist Completed: patient identified, site marked, surgical consent, pre-op evaluation, timeout performed, IV checked, risks and benefits discussed and monitors and equipment checked Spinal Block Patient position: sitting Prep: site prepped and draped and DuraPrep Patient monitoring: heart rate, cardiac monitor, continuous pulse ox and blood pressure Approach: midline Location: L3-4 Injection technique: single-shot Needle Needle type: Sprotte and Pencan  Needle gauge: 24 G Needle length: 9 cm Assessment Sensory level: T4 Additional Notes Initial attempt with sprotte unsuccessful.  Second attempt with Pencan - clear free flow CSF.  No paresthesia.  Patient tolerated procedure well.  Jasmine December, MD

## 2012-06-25 ENCOUNTER — Encounter (HOSPITAL_COMMUNITY): Payer: Self-pay | Admitting: Obstetrics and Gynecology

## 2012-06-25 DIAGNOSIS — D62 Acute posthemorrhagic anemia: Secondary | ICD-10-CM | POA: Diagnosis not present

## 2012-06-25 LAB — CBC
HCT: 29.4 % — ABNORMAL LOW (ref 36.0–46.0)
MCHC: 32.7 g/dL (ref 30.0–36.0)
MCV: 86 fL (ref 78.0–100.0)
Platelets: 204 10*3/uL (ref 150–400)
RDW: 13.7 % (ref 11.5–15.5)
WBC: 9.8 10*3/uL (ref 4.0–10.5)

## 2012-06-25 MED ORDER — INFLUENZA VIRUS VACC SPLIT PF IM SUSP: 0.5000 mL | INTRAMUSCULAR | Status: DC | PRN

## 2012-06-25 NOTE — Progress Notes (Signed)
Patient ID: Lisa Wolf, female   DOB: [birth year], 31 y.o.   MRN: [id number]  POSTOPERATIVE DAY # 1 S/P cesarean section   S:         Reports feeling ok             Tolerating po intake / no nausea / no vomiting / no flatus / no BM             Bleeding is light             Pain controlled with narcotic pain medication             Up ad lib / ambulatory/ voiding QS  Newborn breast feeding     O:  VS: BP 116/73  Pulse 78  Temp 97.8 F (36.6 C) (Oral)  Resp 18  Ht 5\' 6"  (1.676 m)  Wt 98.884 kg (218 lb)  BMI 35.19 kg/m2  SpO2 97%  LMP 09/17/2011  Breastfeeding? Unknown   LABS:  Basename 06/25/12 0607 06/24/12 0935  WBC 9.8 8.1  HGB 9.6* 11.3*  PLT 204 279                           I&O:  +1876             Physical Exam:             Alert and Oriented X3  Lungs: Clear and unlabored  Heart: regular rate and rhythm / no mumurs  Abdomen: soft, non-tender, non-distended, active BS             Fundus: firm, non-tender, U-1             Dressing intact without drainage              Lochia: spotting  Extremities: trace edema, no calf pain or tenderness, SCD in place  A:        POD # 1 S/P cesarean section            ABL Anemia - postoperative  P:        Routine postoperative care              Iron supplement starting tomorrow             Advance activity per postop orders   Marlinda Mike CNM, MSN 06/25/2012, 9:05 AM

## 2012-06-25 NOTE — Addendum Note (Signed)
Addendum  created 06/25/12 0804 by Suella Grove, CRNA   Modules edited:Notes Section

## 2012-06-25 NOTE — Anesthesia Postprocedure Evaluation (Signed)
  Anesthesia Post-op Note  Patient: Lisa Wolf  Procedure(s) Performed: Procedure(s) (LRB) with comments: CESAREAN SECTION (N/A)  Patient Location: Mother/Baby  Anesthesia Type:Spinal  Level of Consciousness: awake, alert  and oriented  Airway and Oxygen Therapy: Patient Spontanous Breathing  Post-op Pain: 0 /10  Post-op Assessment: Patient's Cardiovascular Status Stable, Respiratory Function Stable, Patent Airway, No signs of Nausea or vomiting, Adequate PO intake, Pain level controlled, No headache, No backache, No residual numbness and No residual motor weakness  Post-op Vital Signs: Reviewed and stable  Complications: No apparent anesthesia complications

## 2012-06-26 MED ORDER — POLYSACCHARIDE IRON COMPLEX 150 MG PO CAPS
150.0000 mg | ORAL_CAPSULE | Freq: Every day | ORAL | Status: DC
  Administered 2012-06-26 – 2012-06-27 (×2): 150 mg via ORAL
  Filled 2012-06-26 (×4): qty 1

## 2012-06-26 MED ORDER — DOCUSATE SODIUM 100 MG PO CAPS
100.0000 mg | ORAL_CAPSULE | Freq: Every day | ORAL | Status: DC
  Administered 2012-06-26 – 2012-06-27 (×2): 100 mg via ORAL
  Filled 2012-06-26 (×2): qty 1

## 2012-06-26 NOTE — Progress Notes (Addendum)
Subjective: POD# 2 Information for the patient's newborn:  Lisa Wolf, Schindler Girl Lisa Wolf [[id number]]  female    Reports feeling sore but well. Feeding: breast Patient reports tolerating PO.  Breast symptoms: none Pain controlled with Motrin and Percocet. Denies HA/SOB/C/P/N/V/dizziness. Flatus present. She reports vaginal bleeding as normal, without clots.  She is ambulating, urinating without difficult.     Objective:   VS:  Filed Vitals:   06/25/12 0831 06/25/12 1230 06/25/12 1630 06/25/12 2230  BP: 116/73 124/73 117/72 107/67  Pulse: 78 76 75 74  Temp: 97.8 F (36.6 C) 97.8 F (36.6 C) 98.1 F (36.7 C) 98.3 F (36.8 C)  TempSrc: Oral Oral Oral Oral  Resp: 18 18 20 20   Height:      Weight:      SpO2: 97% 96% 96%      Intake/Output Summary (Last 24 hours) at 06/26/12 0917 Last data filed at 06/25/12 1614  Gross per 24 hour  Intake      0 ml  Output   1500 ml  Net  -1500 ml        Basename 06/25/12 0607 06/24/12 0935  WBC 9.8 8.1  HGB 9.6* 11.3*  HCT 29.4* 34.4*  PLT 204 279     Blood type: --/--/O POS, O POS (10/29 0935)  Rubella: Immune (03/19 0000)     Physical Exam:  General: alert, cooperative and no distress CV: Regular rate and rhythm Resp: clear Abdomen: soft, nontender, normal bowel sounds Incision: clean, dry, intact and dressing in place Uterine Fundus: firm, below umbilicus, nontender Lochia: minimal Ext: edema +2 pedal and Homans sign is negative, no sign of DVT      Assessment/Plan: 31 y.o.   POD# 2. [id number]                  Active Problems:  Postpartum care following cesarean delivery (10/29)  Postoperative anemia due to acute blood loss  Doing well, stable.  No evidence PEC, BP normalized Oral Fe and colace 4-6 wks PP   TDaP given, plan flu vaccine prior to DC Ambulate Routine post-op care Anticipate discharge home in AM.   PAUL,DANIELA 06/26/2012, 9:17 AM

## 2012-06-27 MED ORDER — POLYSACCHARIDE IRON COMPLEX 150 MG PO CAPS: 150.0000 mg | ORAL_CAPSULE | Freq: Every day | ORAL | Status: DC

## 2012-06-27 MED ORDER — IBUPROFEN 600 MG PO TABS: 600.0000 mg | ORAL_TABLET | Freq: Four times a day (QID) | ORAL | Status: DC | PRN

## 2012-06-27 MED ORDER — OXYCODONE-ACETAMINOPHEN 5-325 MG PO TABS: 1.0000 | ORAL_TABLET | ORAL | Status: DC | PRN

## 2012-06-27 MED ORDER — DSS 100 MG PO CAPS: 100.0000 mg | ORAL_CAPSULE | Freq: Every day | ORAL | Status: DC

## 2012-06-27 NOTE — Discharge Summary (Signed)
POSTOPERATIVE DISCHARGE SUMMARY:  Patient ID: Lisa Wolf MRN: [id number] DOB/AGE: 31-Jun-1982 31 y.o.  Admit date: 06/24/2012 Discharge date:  06/27/2012   Admission Diagnoses: 1. Term pregnancy  2. Gestational hypertension  Discharge Diagnoses:  1. Term Pregnancy-delivered, primary c-section for fetal intolerance to labor 2. Iron deficiency anemia of pregnancy with superimposed acute blood loss anemia    Prenatal history: G1P1001   EDC : 06/23/2012, by Last Menstrual Period  Prenatal care at [facility] since [redacted] weeks gestation  Prenatal course complicated by gestation hypertension and borderline oligohydramnios   Prenatal Labs: ABO, Rh: O (03/19 0000)  Antibody: NEG (10/29 0935) Rubella: Immune (03/19 0000)  RPR: NON REACTIVE (10/29 0935)  HBsAg: Negative (03/19 0000)  HIV: Non-reactive (03/19 0000)  GBS: Negative (09/27 0000)  1 hr Glucola : 118   Medical / Surgical History :  Past medical history:  Past Medical History  Diagnosis Date  . Abnormal Pap smear   . Pregnancy induced hypertension     Past surgical history:  Past Surgical History  Procedure Date  . Wisdom tooth extraction   . Cesarean section 06/24/2012    Procedure: CESAREAN SECTION;  Surgeon: Lenoard Aden, MD;  Location: [facility];  Service: Obstetrics;  Laterality: N/A;    Family History:  Family History  Problem Relation Age of Onset  . Asthma Mother     Social History:  reports that she has been smoking.  She has never used smokeless tobacco. She reports that she does not drink alcohol or use illicit drugs.   Allergies: Ciprofloxacin    Current Medications at time of admission:  Prescriptions prior to admission  Medication Sig Dispense Refill  . guaiFENesin (MUCINEX) 600 MG 12 hr tablet Take 1,200 mg by mouth as needed. conjestion      . Prenatal Vit-Fe Fumarate-FA (PRENATAL MULTIVITAMIN) TABS Take 1 tablet by mouth daily.      . ranitidine (ZANTAC) 150 MG tablet  Take 150 mg by mouth 2 (two) times daily.      Marland Kitchen DISCONTD: acetaminophen (TYLENOL) 500 MG tablet Take 500 mg by mouth every 6 (six) hours as needed. pain      . DISCONTD: calcium carbonate (TUMS - DOSED IN MG ELEMENTAL CALCIUM) 500 MG chewable tablet Chew 1 tablet by mouth as needed. heartburn         Admit labs:  06/24/2012 09:35  BUN 6  Creatinine 0.58  Uric Acid, Serum 5.6  AST 16  ALT 13  WBC 8.1  Hemoglobin 11.3 (L)  HCT 34.4 (L)  Platelets 279     Intrapartum Course: Patient admitted for cervical ripening with vaginal Cytotec. Blood pressure remained stable and admitting labs showed no evidence of pre-eclampsia. Fetal monitoring revealed progressive fetal heart rate decelerations, deep variables and late decelerations with contractions unrelieved by intrauterine resuscitative measures. Patient was counseled appropriately at that time and agreed to delivery by c-section.   Procedures: Cesarean section delivery of female newborn by Dr Billy Coast.  See operative report for further details  Postoperative / postpartum course:  Uneventful. Patient received TDaP booster during hospital stay, she declined flu vaccine.   Physical Exam:  VSS: Blood pressure 146/76, pulse 74, temperature 98.3 F (36.8 C), temperature source Axillary, resp. rate 20, height 5\' 6"  (1.676 m), weight 98.884 kg (218 lb), last menstrual period 09/17/2011, SpO2 98.00%, unknown if currently breastfeeding.   LABS:  Basename 06/25/12 0607 06/24/12 0935  WBC 9.8 8.1  HGB 9.6* 11.3*  HCT  29.4* 34.4*  PLT 204 279    Incision:  approximated with staples / no erythema / mild ecchymosis / no drainage Staples:  removed prior to discharge and replaced with benzoin and steri strips.   Discharge Instructions:  Discharged Condition: good Activity: pelvic rest, weight lifting and driving restrictions x 2 weeks Diet: routine Medications:    Medication List     As of 06/27/2012  9:09 AM    STOP taking these  medications         acetaminophen 500 MG tablet   Commonly known as: TYLENOL      calcium carbonate 500 MG chewable tablet   Commonly known as: TUMS - dosed in mg elemental calcium      TAKE these medications         DSS 100 MG Caps   Take 100 mg by mouth daily.      ibuprofen 600 MG tablet   Commonly known as: ADVIL,MOTRIN   Take 1 tablet (600 mg total) by mouth every 6 (six) hours as needed.      iron polysaccharides 150 MG capsule   Commonly known as: NIFEREX   Take 1 capsule (150 mg total) by mouth daily.      MUCINEX 600 MG 12 hr tablet   Generic drug: guaiFENesin   Take 1,200 mg by mouth as needed. conjestion      oxyCODONE-acetaminophen 5-325 MG per tablet   Commonly known as: PERCOCET/ROXICET   Take 1-2 tablets by mouth every 4 (four) hours as needed (moderate - severe pain).      prenatal multivitamin Tabs   Take 1 tablet by mouth daily.      ranitidine 150 MG tablet   Commonly known as: ZANTAC   Take 150 mg by mouth 2 (two) times daily.       Condition: stable Postpartum Instructions: refer to practice specific booklet Discharge to: home Disposition: Final discharge disposition not confirmed Follow up :      Follow-up Information    Follow up with Lenoard Aden, MD. Schedule an appointment as soon as possible for a visit in 6 weeks.   Contact information:   [address] [place] [address] [phone]           Signed: Arlan Organ 06/27/2012, 9:09 AM

## 2012-06-27 NOTE — Progress Notes (Signed)
Subjective: POD# 3 Information for the patient's newborn:  [place], Hynson Girl Twylla [[id number]]  female     Reports feeling well, soreness improved. Feeding: breast Patient reports tolerating PO.  Breast symptoms: none Pain controlled with Motrin and Percocet. Denies HA/SOB/C/P/N/V/dizziness. Flatus present. She reports vaginal bleeding as normal, without clots.  She is ambulating, urinating without difficult.     Objective:   VS:  Filed Vitals:   06/25/12 2230 06/26/12 1353 06/26/12 2205 06/27/12 0643  BP: 107/67 134/69 135/70 146/76  Pulse: 74 80 78 74  Temp: 98.3 F (36.8 C) 99 F (37.2 C) 98.4 F (36.9 C) 98.3 F (36.8 C)  TempSrc: Oral Oral Oral Axillary  Resp: 20 18 18 20   Height:      Weight:      SpO2:  98% 98%          Basename 06/25/12 0607 06/24/12 0935  WBC 9.8 8.1  HGB 9.6* 11.3*  HCT 29.4* 34.4*  PLT 204 279     Blood type: --/--/O POS, O POS (10/29 0935)  Rubella: Immune (03/19 0000)  Physical Exam:  General: alert, cooperative and no distress CV: Regular rate and rhythm Resp: clear Abdomen: soft, nontender, normal bowel sounds Incision: clean, dry, intact staples Uterine Fundus: firm, below umbilicus, nontender Lochia: minimal Ext: edema +2 pedal and Homans sign is negative, no sign of DVT      Assessment/Plan: 31 y.o.   POD# 3. [id number]                  Active Problems:  Postpartum care following cesarean delivery (10/29)  Postoperative anemia due to acute blood loss  Doing well, stable.  No evidence PEC, BP normalized Oral Fe and colace 4-6 wks PP   TDaP given, plan flu vaccine prior to DC   removed prior to discharge and replaced with benzoin and steri strips.  Stable for DC.  PAUL,DANIELA 06/27/2012, 8:45 AM

## 2012-07-07 ENCOUNTER — Telehealth (HOSPITAL_COMMUNITY): Payer: Self-pay | Admitting: *Deleted

## 2012-07-07 NOTE — Telephone Encounter (Signed)
Resolve episode 

## 2014-06-28 ENCOUNTER — Encounter (HOSPITAL_COMMUNITY): Payer: Self-pay | Admitting: Obstetrics and Gynecology

## 2015-07-23 ENCOUNTER — Emergency Department (HOSPITAL_COMMUNITY)
Admission: EM | Admit: 2015-07-23 | Discharge: 2015-07-24 | Disposition: A | Discharge status: Home / Self Care | Payer: BLUE CROSS/BLUE SHIELD | Attending: Emergency Medicine | Admitting: Emergency Medicine

## 2015-07-23 ENCOUNTER — Encounter (HOSPITAL_COMMUNITY): Payer: Self-pay | Admitting: *Deleted

## 2015-07-23 DIAGNOSIS — Z79899 Other long term (current) drug therapy: Secondary | ICD-10-CM | POA: Insufficient documentation

## 2015-07-23 DIAGNOSIS — F17210 Nicotine dependence, cigarettes, uncomplicated: Secondary | ICD-10-CM | POA: Insufficient documentation

## 2015-07-23 DIAGNOSIS — R079 Chest pain, unspecified: Secondary | ICD-10-CM | POA: Diagnosis not present

## 2015-07-23 DIAGNOSIS — L299 Pruritus, unspecified: Secondary | ICD-10-CM | POA: Diagnosis present

## 2015-07-23 DIAGNOSIS — L509 Urticaria, unspecified: Secondary | ICD-10-CM

## 2015-07-23 DIAGNOSIS — R202 Paresthesia of skin: Secondary | ICD-10-CM | POA: Insufficient documentation

## 2015-07-23 MED ORDER — FAMOTIDINE IN NACL 20-0.9 MG/50ML-% IV SOLN
20.0000 mg | Freq: Once | INTRAVENOUS | Status: AC
  Administered 2015-07-23: 20 mg via INTRAVENOUS
  Filled 2015-07-23: qty 50

## 2015-07-23 MED ORDER — METHYLPREDNISOLONE SODIUM SUCC 125 MG IJ SOLR
125.0000 mg | Freq: Once | INTRAMUSCULAR | Status: AC
  Administered 2015-07-23: 125 mg via INTRAVENOUS
  Filled 2015-07-23: qty 2

## 2015-07-23 MED ORDER — DIPHENHYDRAMINE HCL 50 MG/ML IJ SOLN
25.0000 mg | Freq: Once | INTRAMUSCULAR | Status: AC
  Administered 2015-07-23: 25 mg via INTRAVENOUS
  Filled 2015-07-23: qty 1

## 2015-07-23 NOTE — ED Provider Notes (Signed)
CSN: [id number]     Arrival date & time 07/23/15  2102 History   First MD Initiated Contact with Patient 07/23/15 2311     Chief Complaint  Patient presents with  . Urticaria     (Consider location/radiation/quality/duration/timing/severity/associated sxs/prior Treatment) Patient is a 34 y.o. female presenting with urticaria. The history is provided by the patient.  Urticaria  She started breaking out in hives 2 days ago and they have been spreading. She has noted some swelling of her hands and some swelling around her left eye. This evening, she had intermittent episodes where her chest felt tight. This tight feeling would only last a few minutes before resolving spontaneously. No difficulty swallowing. She is complaining of generalized itching although there is also some burning and tingling in her hands. She had hives on one other occasion with no cause being found her to denies any new medication, soaps, laundry detergents, fabric softeners, cosmetics, etc. She has been taking diphenhydramine without relief.  Past Medical History  Diagnosis Date  . Abnormal Pap smear   . Pregnancy induced hypertension    Past Surgical History  Procedure Laterality Date  . Wisdom tooth extraction    . Cesarean section  06/24/2012    Procedure: CESAREAN SECTION;  Surgeon: Lovenia Kim, MD;  Location: [facility];  Service: Obstetrics;  Laterality: N/A;   Family History  Problem Relation Age of Onset  . Asthma Mother    Social History  Substance Use Topics  . Smoking status: Light Tobacco Smoker  . Smokeless tobacco: Never Used  . Alcohol Use: No   OB History    Gravida Para Term Preterm AB TAB SAB Ectopic Multiple Living   1 1 1  0 0 0 0 0 0 1     Review of Systems  All other systems reviewed and are negative.     Allergies  Ciprofloxacin  Home Medications   Prior to Admission medications   Medication Sig Start Date End Date Taking? Authorizing Provider  diphenhydrAMINE  (BENADRYL) 25 MG tablet Take 50 mg by mouth every 4 (four) hours as needed for itching or allergies.   Yes Historical Provider, MD  ibuprofen (ADVIL,MOTRIN) 200 MG tablet Take 200 mg by mouth every 6 (six) hours as needed for mild pain or moderate pain.   Yes Historical Provider, MD  TRI-SPRINTEC 0.18/0.215/0.25 MG-35 MCG tablet Take 1 tablet by mouth daily. 07/03/15  Yes Historical Provider, MD   BP 132/73 mmHg  Pulse 92  Temp(Src) 98.1 F (36.7 C) (Oral)  Resp 18  Ht 5\' 6"  (1.676 m)  Wt 180 lb (81.647 kg)  BMI 29.07 kg/m2  SpO2 100%  LMP 06/22/2015 Physical Exam  Nursing note and vitals reviewed.  34 year old female, resting comfortably and in no acute distress. Vital signs are normal. Oxygen saturation is 100%, which is normal. Head is normocephalic and atraumatic. PERRLA, EOMI. Oropharynx is clear. Mild left periorbital edema present. Neck is nontender and supple without adenopathy or JVD. Back is nontender and there is no CVA tenderness. Lungs are clear without rales, wheezes, or rhonchi. Chest is nontender. Heart has regular rate and rhythm without murmur. Abdomen is soft, flat, nontender without masses or hepatosplenomegaly and peristalsis is normoactive. Extremities have no cyanosis or edema, full range of motion is present. Mild swelling is noted in both hands-predominantly in the thenar and hyperthenar areas. Skin is warm and dry. Scattered urticarial lesions are present on the trunk, arms, legs.. Neurologic: Mental status is normal, cranial  nerves are intact, there are no motor or sensory deficits.  ED Course  Procedures (including critical care time)   EKG Interpretation   Date/Time:  Saturday July 23 2015 21:13:20 EST Ventricular Rate:  84 PR Interval:  114 QRS Duration: 82 QT Interval:  376 QTC Calculation: 444 R Axis:   72 Text Interpretation:  Normal sinus rhythm Normal ECG No old tracing to  compare Confirmed by [facility]  MD, Sigurd Pugh ([id number]) on 07/23/2015  11:02:39 PM      MDM   Final diagnoses:  Urticaria    Urticaria of uncertain cause. His old records are reviewed and she has no relevant past visits. She is given diphenhydramine, famotidine, and methylprednisolone and will be observed in the ED.  She had good relief of symptoms with above noted treatment. She is discharged with prescription for prednisone and is advised to take over-the-counter Claritin and over-the-counter Pepcid AC for the next 5 days.  Delora Fuel, MD [id number]

## 2015-07-23 NOTE — ED Notes (Signed)
Pt c/o hives since Thursday that have been getting increasingly worse. The hive are on faces, buttocks, side, and hands.Pt states within the last hour she has began having chest tightness.

## 2015-07-24 MED ORDER — PREDNISONE 50 MG PO TABS: 50.0000 mg | ORAL_TABLET | Freq: Every day | ORAL | Status: DC

## 2015-07-24 NOTE — Discharge Instructions (Signed)
Take Claritin once a day, Pepcid AC twice a day for the next five days. Take diphenhydramine (Benadryl) as needed for itching that is not controlled by Claritin and Pepcid.   Hives Hives are itchy, red, swollen areas of the skin. They can vary in size and location on your body. Hives can come and go for hours or several days (acute hives) or for several weeks (chronic hives). Hives do not spread from person to person (noncontagious). They may get worse with scratching, exercise, and emotional stress. CAUSES   Allergic reaction to food, additives, or drugs.  Infections, including the common cold.  Illness, such as vasculitis, lupus, or thyroid disease.  Exposure to sunlight, heat, or cold.  Exercise.  Stress.  Contact with chemicals. SYMPTOMS   Red or white swollen patches on the skin. The patches may change size, shape, and location quickly and repeatedly.  Itching.  Swelling of the hands, feet, and face. This may occur if hives develop deeper in the skin. DIAGNOSIS  Your caregiver can usually tell what is wrong by performing a physical exam. Skin or blood tests may also be done to determine the cause of your hives. In some cases, the cause cannot be determined. TREATMENT  Mild cases usually get better with medicines such as antihistamines. Severe cases may require an emergency epinephrine injection. If the cause of your hives is known, treatment includes avoiding that trigger.  HOME CARE INSTRUCTIONS   Avoid causes that trigger your hives.  Take antihistamines as directed by your caregiver to reduce the severity of your hives. Non-sedating or low-sedating antihistamines are usually recommended. Do not drive while taking an antihistamine.  Take any other medicines prescribed for itching as directed by your caregiver.  Wear loose-fitting clothing.  Keep all follow-up appointments as directed by your caregiver. SEEK MEDICAL CARE IF:   You have persistent or severe itching  that is not relieved with medicine.  You have painful or swollen joints. SEEK IMMEDIATE MEDICAL CARE IF:   You have a fever.  Your tongue or lips are swollen.  You have trouble breathing or swallowing.  You feel tightness in the throat or chest.  You have abdominal pain. These problems may be the first sign of a life-threatening allergic reaction. Call your local emergency services (911 in [place] MAKE SURE YOU:   Understand these instructions.  Will watch your condition.  Will get help right away if you are not doing well or get worse.   This information is not intended to replace advice given to you by your health care provider. Make sure you discuss any questions you have with your health care provider.   Document Released: 08/13/2005 Document Revised: 08/18/2013 Document Reviewed: 11/06/2011 Elsevier Interactive Patient Education 2016 Elsevier Inc.  Prednisone tablets What is this medicine? PREDNISONE (PRED ni sone) is a corticosteroid. It is commonly used to treat inflammation of the skin, joints, lungs, and other organs. Common conditions treated include asthma, allergies, and arthritis. It is also used for other conditions, such as blood disorders and diseases of the adrenal glands. This medicine may be used for other purposes; ask your health care provider or pharmacist if you have questions. What should I tell my health care provider before I take this medicine? They need to know if you have any of these conditions: -Cushing's syndrome -diabetes -glaucoma -heart disease -high blood pressure -infection (especially a virus infection such as chickenpox, cold sores, or herpes) -kidney disease -liver disease -mental illness -myasthenia gravis -osteoporosis -seizures -  stomach or intestine problems -thyroid disease -an unusual or allergic reaction to lactose, prednisone, other medicines, foods, dyes, or preservatives -pregnant or trying to get  pregnant -breast-feeding How should I use this medicine? Take this medicine by mouth with a glass of water. Follow the directions on the prescription label. Take this medicine with food. If you are taking this medicine once a day, take it in the morning. Do not take more medicine than you are told to take. Do not suddenly stop taking your medicine because you may develop a severe reaction. Your doctor will tell you how much medicine to take. If your doctor wants you to stop the medicine, the dose may be slowly lowered over time to avoid any side effects. Talk to your pediatrician regarding the use of this medicine in children. Special care may be needed. Overdosage: If you think you have taken too much of this medicine contact a poison control center or emergency room at once. NOTE: This medicine is only for you. Do not share this medicine with others. What if I miss a dose? If you miss a dose, take it as soon as you can. If it is almost time for your next dose, talk to your doctor or health care professional. You may need to miss a dose or take an extra dose. Do not take double or extra doses without advice. What may interact with this medicine? Do not take this medicine with any of the following medications: -metyrapone -mifepristone This medicine may also interact with the following medications: -aminoglutethimide -amphotericin B -aspirin and aspirin-like medicines -barbiturates -certain medicines for diabetes, like glipizide or glyburide -cholestyramine -cholinesterase inhibitors -cyclosporine -digoxin -diuretics -ephedrine -female hormones, like estrogens and birth control pills -isoniazid -ketoconazole -NSAIDS, medicines for pain and inflammation, like ibuprofen or naproxen -phenytoin -rifampin -toxoids -vaccines -warfarin This list may not describe all possible interactions. Give your health care provider a list of all the medicines, herbs, non-prescription drugs, or dietary  supplements you use. Also tell them if you smoke, drink alcohol, or use illegal drugs. Some items may interact with your medicine. What should I watch for while using this medicine? Visit your doctor or health care professional for regular checks on your progress. If you are taking this medicine over a prolonged period, carry an identification card with your name and address, the type and dose of your medicine, and your doctor's name and address. This medicine may increase your risk of getting an infection. Tell your doctor or health care professional if you are around anyone with measles or chickenpox, or if you develop sores or blisters that do not heal properly. If you are going to have surgery, tell your doctor or health care professional that you have taken this medicine within the last twelve months. Ask your doctor or health care professional about your diet. You may need to lower the amount of salt you eat. This medicine may affect blood sugar levels. If you have diabetes, check with your doctor or health care professional before you change your diet or the dose of your diabetic medicine. What side effects may I notice from receiving this medicine? Side effects that you should report to your doctor or health care professional as soon as possible: -allergic reactions like skin rash, itching or hives, swelling of the face, lips, or tongue -changes in emotions or moods -changes in vision -depressed mood -eye pain -fever or chills, cough, sore throat, pain or difficulty passing urine -increased thirst -swelling of ankles, feet Side  effects that usually do not require medical attention (report to your doctor or health care professional if they continue or are bothersome): -confusion, excitement, restlessness -headache -nausea, vomiting -skin problems, acne, thin and shiny skin -trouble sleeping -weight gain This list may not describe all possible side effects. Call your doctor for medical  advice about side effects. You may report side effects to FDA at 1-800-FDA-1088. Where should I keep my medicine? Keep out of the reach of children. Store at room temperature between 15 and 30 degrees C (59 and 86 degrees F). Protect from light. Keep container tightly closed. Throw away any unused medicine after the expiration date. NOTE: This sheet is a summary. It may not cover all possible information. If you have questions about this medicine, talk to your doctor, pharmacist, or health care provider.    2016, Elsevier/Gold Standard. (2011-03-29 10:57:14)

## 2015-09-08 ENCOUNTER — Encounter (HOSPITAL_COMMUNITY): Payer: Self-pay | Admitting: Anesthesiology

## 2015-09-08 ENCOUNTER — Other Ambulatory Visit (HOSPITAL_COMMUNITY): Payer: Self-pay | Admitting: Internal Medicine

## 2015-09-08 ENCOUNTER — Encounter (HOSPITAL_COMMUNITY): Admission: EM | Disposition: A | Discharge status: Home / Self Care | Payer: Self-pay | Source: Home / Self Care

## 2015-09-08 ENCOUNTER — Emergency Department (HOSPITAL_COMMUNITY): Payer: BLUE CROSS/BLUE SHIELD | Admitting: Anesthesiology

## 2015-09-08 ENCOUNTER — Observation Stay (HOSPITAL_COMMUNITY)
Admission: EM | Admit: 2015-09-08 | Discharge: 2015-09-09 | Disposition: A | Discharge status: Home / Self Care | Payer: BLUE CROSS/BLUE SHIELD | Attending: General Surgery | Admitting: General Surgery

## 2015-09-08 ENCOUNTER — Ambulatory Visit (HOSPITAL_COMMUNITY)
Admission: RE | Admit: 2015-09-08 | Discharge: 2015-09-08 | Disposition: A | Discharge status: Home / Self Care | Payer: BLUE CROSS/BLUE SHIELD | Source: Ambulatory Visit | Attending: Internal Medicine | Admitting: Internal Medicine

## 2015-09-08 DIAGNOSIS — K3580 Unspecified acute appendicitis: Principal | ICD-10-CM | POA: Insufficient documentation

## 2015-09-08 DIAGNOSIS — F17200 Nicotine dependence, unspecified, uncomplicated: Secondary | ICD-10-CM | POA: Diagnosis not present

## 2015-09-08 DIAGNOSIS — R1084 Generalized abdominal pain: Secondary | ICD-10-CM

## 2015-09-08 HISTORY — PX: LAPAROSCOPIC APPENDECTOMY: SHX408

## 2015-09-08 LAB — CBC WITH DIFFERENTIAL/PLATELET
BASOS ABS: 0 10*3/uL (ref 0.0–0.1)
BASOS PCT: 0 %
EOS PCT: 1 %
Eosinophils Absolute: 0.2 10*3/uL (ref 0.0–0.7)
HCT: 36.3 % (ref 36.0–46.0)
Hemoglobin: 12.5 g/dL (ref 12.0–15.0)
Lymphocytes Relative: 30 %
Lymphs Abs: 3.7 10*3/uL (ref 0.7–4.0)
MCH: 31 pg (ref 26.0–34.0)
MCHC: 34.4 g/dL (ref 30.0–36.0)
MCV: 90.1 fL (ref 78.0–100.0)
MONO ABS: 0.9 10*3/uL (ref 0.1–1.0)
Monocytes Relative: 7 %
Neutro Abs: 7.7 10*3/uL (ref 1.7–7.7)
Neutrophils Relative %: 62 %
PLATELETS: 313 10*3/uL (ref 150–400)
RBC: 4.03 MIL/uL (ref 3.87–5.11)
RDW: 12.8 % (ref 11.5–15.5)
WBC: 12.4 10*3/uL — ABNORMAL HIGH (ref 4.0–10.5)

## 2015-09-08 LAB — BASIC METABOLIC PANEL
Anion gap: 11 (ref 5–15)
BUN: 8 mg/dL (ref 6–20)
CALCIUM: 8.8 mg/dL — AB (ref 8.9–10.3)
CO2: 24 mmol/L (ref 22–32)
CREATININE: 0.61 mg/dL (ref 0.44–1.00)
Chloride: 100 mmol/L — ABNORMAL LOW (ref 101–111)
GFR calc Af Amer: >60 mL/min (ref >60)
GLUCOSE: 95 mg/dL (ref 65–99)
Potassium: 3.8 mmol/L (ref 3.5–5.1)
Sodium: 135 mmol/L (ref 135–145)

## 2015-09-08 SURGERY — APPENDECTOMY, LAPAROSCOPIC
Anesthesia: General

## 2015-09-08 MED ORDER — DIATRIZOATE MEGLUMINE & SODIUM 66-10 % PO SOLN
ORAL | Status: AC
  Filled 2015-09-08: qty 30

## 2015-09-08 MED ORDER — MIDAZOLAM HCL 2 MG/2ML IJ SOLN
INTRAMUSCULAR | Status: AC
  Filled 2015-09-08: qty 2

## 2015-09-08 MED ORDER — PROPOFOL 10 MG/ML IV BOLUS
INTRAVENOUS | Status: AC
  Filled 2015-09-08: qty 20

## 2015-09-08 MED ORDER — NEOSTIGMINE METHYLSULFATE 10 MG/10ML IV SOLN
INTRAVENOUS | Status: AC
  Filled 2015-09-08: qty 1

## 2015-09-08 MED ORDER — LACTATED RINGERS IV SOLN
INTRAVENOUS | Status: DC
  Administered 2015-09-08 (×2): via INTRAVENOUS

## 2015-09-08 MED ORDER — KETOROLAC TROMETHAMINE 30 MG/ML IJ SOLN
30.0000 mg | Freq: Once | INTRAMUSCULAR | Status: AC
  Administered 2015-09-08: 30 mg via INTRAVENOUS
  Filled 2015-09-08: qty 1

## 2015-09-08 MED ORDER — ROCURONIUM BROMIDE 100 MG/10ML IV SOLN
INTRAVENOUS | Status: DC | PRN
  Administered 2015-09-08: 20 mg via INTRAVENOUS
  Administered 2015-09-08: 7 mg via INTRAVENOUS

## 2015-09-08 MED ORDER — ONDANSETRON HCL 4 MG/2ML IJ SOLN
INTRAMUSCULAR | Status: DC | PRN
  Administered 2015-09-08: 4 mg via INTRAVENOUS

## 2015-09-08 MED ORDER — IOHEXOL 300 MG/ML  SOLN
100.0000 mL | Freq: Once | INTRAMUSCULAR | Status: AC | PRN
  Administered 2015-09-08: 100 mL via INTRAVENOUS

## 2015-09-08 MED ORDER — FENTANYL CITRATE (PF) 250 MCG/5ML IJ SOLN
INTRAMUSCULAR | Status: DC | PRN
  Administered 2015-09-08 (×6): 50 ug via INTRAVENOUS

## 2015-09-08 MED ORDER — MIDAZOLAM HCL 5 MG/5ML IJ SOLN
INTRAMUSCULAR | Status: DC | PRN
  Administered 2015-09-08: 2 mg via INTRAVENOUS

## 2015-09-08 MED ORDER — GLYCOPYRROLATE 0.2 MG/ML IJ SOLN
INTRAMUSCULAR | Status: AC
  Filled 2015-09-08: qty 1

## 2015-09-08 MED ORDER — PIPERACILLIN-TAZOBACTAM 3.375 G IVPB 30 MIN
3.3750 g | Freq: Once | INTRAVENOUS | Status: AC
  Administered 2015-09-08: 3.375 g via INTRAVENOUS
  Filled 2015-09-08: qty 50

## 2015-09-08 MED ORDER — PROPOFOL 10 MG/ML IV BOLUS
INTRAVENOUS | Status: DC | PRN
  Administered 2015-09-08: 150 mg via INTRAVENOUS

## 2015-09-08 MED ORDER — NEOSTIGMINE METHYLSULFATE 10 MG/10ML IV SOLN
INTRAVENOUS | Status: DC | PRN
  Administered 2015-09-08: 4 mg via INTRAVENOUS

## 2015-09-08 MED ORDER — FENTANYL CITRATE (PF) 250 MCG/5ML IJ SOLN
INTRAMUSCULAR | Status: AC
  Filled 2015-09-08: qty 5

## 2015-09-08 MED ORDER — POVIDONE-IODINE 10 % OINT PACKET
TOPICAL_OINTMENT | CUTANEOUS | Status: DC | PRN
  Administered 2015-09-08: 1 via TOPICAL

## 2015-09-08 MED ORDER — POVIDONE-IODINE 10 % EX OINT
TOPICAL_OINTMENT | CUTANEOUS | Status: AC
  Filled 2015-09-08: qty 1

## 2015-09-08 MED ORDER — FENTANYL CITRATE (PF) 100 MCG/2ML IJ SOLN
INTRAMUSCULAR | Status: AC
  Filled 2015-09-08: qty 2

## 2015-09-08 MED ORDER — SODIUM CHLORIDE 0.9 % IR SOLN
Status: DC | PRN
  Administered 2015-09-08: 500 mL

## 2015-09-08 MED ORDER — GLYCOPYRROLATE 0.2 MG/ML IJ SOLN
INTRAMUSCULAR | Status: DC | PRN
  Administered 2015-09-08: .7 mg via INTRAVENOUS

## 2015-09-08 MED ORDER — BUPIVACAINE HCL (PF) 0.5 % IJ SOLN
INTRAMUSCULAR | Status: AC
  Filled 2015-09-08: qty 30

## 2015-09-08 MED ORDER — LIDOCAINE HCL 1 % IJ SOLN
INTRAMUSCULAR | Status: DC | PRN
  Administered 2015-09-08: 40 mg via INTRADERMAL

## 2015-09-08 MED ORDER — GLYCOPYRROLATE 0.2 MG/ML IJ SOLN
INTRAMUSCULAR | Status: AC
  Filled 2015-09-08: qty 3

## 2015-09-08 MED ORDER — SODIUM CHLORIDE 0.9 % IR SOLN
Status: DC | PRN
  Administered 2015-09-08: 3000 mL

## 2015-09-08 MED ORDER — SUCCINYLCHOLINE CHLORIDE 20 MG/ML IJ SOLN
INTRAMUSCULAR | Status: DC | PRN
  Administered 2015-09-08: 100 mg via INTRAVENOUS

## 2015-09-08 MED ORDER — BUPIVACAINE HCL (PF) 0.5 % IJ SOLN
INTRAMUSCULAR | Status: DC | PRN
  Administered 2015-09-08: 10 mL

## 2015-09-08 MED ORDER — LIDOCAINE HCL (PF) 1 % IJ SOLN
INTRAMUSCULAR | Status: AC
  Filled 2015-09-08: qty 5

## 2015-09-08 SURGICAL SUPPLY — 51 items
BAG HAMPER (MISCELLANEOUS) ×3 IMPLANT
BAG SPEC RTRVL LRG 6X4 10 (ENDOMECHANICALS) ×1
CHLORAPREP W/TINT 26ML (MISCELLANEOUS) ×3 IMPLANT
CLOTH BEACON ORANGE TIMEOUT ST (SAFETY) ×3 IMPLANT
COVER LIGHT HANDLE STERIS (MISCELLANEOUS) ×6 IMPLANT
CUTTER FLEX LINEAR 45M (STAPLE) ×2 IMPLANT
CUTTER LINEAR ENDO 35 ART FLEX (STAPLE) IMPLANT
CUTTER LINEAR ENDO 35 ART THIN (STAPLE) IMPLANT
CUTTER LINEAR ENDO 35 ETS TH (STAPLE) IMPLANT
DECANTER SPIKE VIAL GLASS SM (MISCELLANEOUS) ×3 IMPLANT
DISSECTOR BLUNT TIP ENDO 5MM (MISCELLANEOUS) IMPLANT
ELECT REM PT RETURN 9FT ADLT (ELECTROSURGICAL) ×3
ELECTRODE REM PT RTRN 9FT ADLT (ELECTROSURGICAL) ×1 IMPLANT
EVACUATOR SMOKE 8.L (FILTER) ×3 IMPLANT
FORMALIN 10 PREFIL 120ML (MISCELLANEOUS) ×3 IMPLANT
GLOVE BIOGEL M 7.0 STRL (GLOVE) ×2 IMPLANT
GLOVE BIOGEL PI IND STRL 7.0 (GLOVE) ×1 IMPLANT
GLOVE BIOGEL PI INDICATOR 7.0 (GLOVE) ×2
GLOVE INDICATOR 7.0 STRL GRN (GLOVE) ×4 IMPLANT
GLOVE SURG SS PI 7.5 STRL IVOR (GLOVE) ×6 IMPLANT
GOWN STRL REUS W/ TWL XL LVL3 (GOWN DISPOSABLE) ×1 IMPLANT
GOWN STRL REUS W/TWL LRG LVL3 (GOWN DISPOSABLE) ×3 IMPLANT
GOWN STRL REUS W/TWL XL LVL3 (GOWN DISPOSABLE) ×3
INST SET LAPROSCOPIC AP (KITS) ×3 IMPLANT
IV NS IRRIG 3000ML ARTHROMATIC (IV SOLUTION) ×2 IMPLANT
KIT ROOM TURNOVER APOR (KITS) ×3 IMPLANT
MANIFOLD NEPTUNE II (INSTRUMENTS) ×3 IMPLANT
NEEDLE INSUFFLATION 14GA 120MM (NEEDLE) ×3 IMPLANT
NS IRRIG 1000ML POUR BTL (IV SOLUTION) ×3 IMPLANT
PACK LAP CHOLE LZT030E (CUSTOM PROCEDURE TRAY) ×3 IMPLANT
PAD ARMBOARD 7.5X6 YLW CONV (MISCELLANEOUS) ×3 IMPLANT
POUCH SPECIMEN RETRIEVAL 10MM (ENDOMECHANICALS) ×3 IMPLANT
RELOAD /EVU35 (ENDOMECHANICALS) IMPLANT
RELOAD 45 VASCULAR/THIN (ENDOMECHANICALS) IMPLANT
RELOAD CUTTER ETS 35MM STAND (ENDOMECHANICALS) ×2 IMPLANT
RELOAD STAPLE 45 2.5 WHT GRN (ENDOMECHANICALS) IMPLANT
SET BASIN LINEN APH (SET/KITS/TRAYS/PACK) ×3 IMPLANT
SET TUBE IRRIG SUCTION NO TIP (IRRIGATION / IRRIGATOR) ×3 IMPLANT
SHEARS HARMONIC ACE PLUS 36CM (ENDOMECHANICALS) ×3 IMPLANT
SPONGE GAUZE 2X2 8PLY STER LF (GAUZE/BANDAGES/DRESSINGS) ×3
SPONGE GAUZE 2X2 8PLY STRL LF (GAUZE/BANDAGES/DRESSINGS) ×6 IMPLANT
STAPLER VISISTAT (STAPLE) ×3 IMPLANT
SUT VICRYL 0 UR6 27IN ABS (SUTURE) ×3 IMPLANT
TAPE CLOTH SURG 4X10 WHT LF (GAUZE/BANDAGES/DRESSINGS) ×2 IMPLANT
TRAY FOLEY CATH SILVER 16FR (SET/KITS/TRAYS/PACK) ×3 IMPLANT
TROCAR ENDO BLADELESS 11MM (ENDOMECHANICALS) ×3 IMPLANT
TROCAR ENDO BLADELESS 12MM (ENDOMECHANICALS) ×3 IMPLANT
TROCAR XCEL NON-BLD 5MMX100MML (ENDOMECHANICALS) ×3 IMPLANT
TUBING INSUFFLATION (TUBING) ×3 IMPLANT
WARMER LAPAROSCOPE (MISCELLANEOUS) ×3 IMPLANT
YANKAUER SUCT 12FT TUBE ARGYLE (SUCTIONS) ×3 IMPLANT

## 2015-09-08 NOTE — ED Notes (Signed)
Pt had an outpatient ct abd ordered by dr fagan.  Pt was found to have appendicitis and pt will go to surgery tonight per dr mark Arnoldo Morale.

## 2015-09-08 NOTE — Anesthesia Preprocedure Evaluation (Signed)
Anesthesia Evaluation  Patient identified by MRN, date of birth, ID band Patient awake    Reviewed: Allergy & Precautions, NPO status , Patient's Chart, lab work & pertinent test results  History of Anesthesia Complications Negative for: history of anesthetic complications  Airway Mallampati: I  TM Distance: >3 FB Neck ROM: Full    Dental  (+) Teeth Intact   Pulmonary neg pulmonary ROS, Current Smoker,  Recent cold, sinus drainage   breath sounds clear to auscultation       Cardiovascular Exercise Tolerance: Good hypertension, negative cardio ROS   Rhythm:Regular Rate:Normal     Neuro/Psych    GI/Hepatic negative GI ROS, Neg liver ROS,   Endo/Other  negative endocrine ROS  Renal/GU negative Renal ROS     Musculoskeletal   Abdominal   Peds  Hematology   Anesthesia Other Findings   Reproductive/Obstetrics                             Anesthesia Physical Anesthesia Plan  ASA: I and emergent  Anesthesia Plan: General   Post-op Pain Management:    Induction: Intravenous, Rapid sequence and Cricoid pressure planned  Airway Management Planned: Oral ETT  Additional Equipment:   Intra-op Plan:   Post-operative Plan: Extubation in OR  Informed Consent: I have reviewed the patients History and Physical, chart, labs and discussed the procedure including the risks, benefits and alternatives for the proposed anesthesia with the patient or authorized representative who has indicated his/her understanding and acceptance.     Plan Discussed with: Surgeon  Anesthesia Plan Comments:         Anesthesia Quick Evaluation

## 2015-09-08 NOTE — Anesthesia Procedure Notes (Signed)
Procedure Name: Intubation Date/Time: 09/08/2015 9:42 PM Performed by: Tressie Stalker E Pre-anesthesia Checklist: Patient identified, Patient being monitored, Timeout performed, Emergency Drugs available and Suction available Patient Re-evaluated:Patient Re-evaluated prior to inductionOxygen Delivery Method: Circle System Utilized Preoxygenation: Pre-oxygenation with 100% oxygen Intubation Type: IV induction, Rapid sequence and Cricoid Pressure applied Ventilation: Mask ventilation without difficulty Laryngoscope Size: Mac and 3 Grade View: Grade I Tube type: Oral Tube size: 7.0 mm Number of attempts: 1 Airway Equipment and Method: Stylet Placement Confirmation: ETT inserted through vocal cords under direct vision,  positive ETCO2 and breath sounds checked- equal and bilateral Secured at: 21 cm Tube secured with: Tape Dental Injury: Teeth and Oropharynx as per pre-operative assessment

## 2015-09-08 NOTE — Anesthesia Postprocedure Evaluation (Signed)
Anesthesia Post Note  Patient: Lisa Wolf  Procedure(s) Performed: Procedure(s) (LRB): APPENDECTOMY LAPAROSCOPIC (N/A)  Patient location during evaluation: PACU Anesthesia Type: General Level of consciousness: awake and alert Pain management: pain level controlled Vital Signs Assessment: post-procedure vital signs reviewed and stable Respiratory status: spontaneous breathing Cardiovascular status: blood pressure returned to baseline Postop Assessment: no signs of nausea or vomiting Anesthetic complications: no    Last Vitals:  Filed Vitals:   09/08/15 2027 09/08/15 2315  BP: 114/68   Pulse: 89   Temp: 36.8 C 36.5 C  Resp: 16     Last Pain:  Filed Vitals:   09/08/15 2320  PainSc: Asleep                 Azara Gemme

## 2015-09-08 NOTE — Transfer of Care (Signed)
Immediate Anesthesia Transfer of Care Note  Patient: Lisa Wolf  Procedure(s) Performed: Procedure(s): APPENDECTOMY LAPAROSCOPIC (N/A)  Patient Location: PACU  Anesthesia Type:General  Level of Consciousness: awake, alert  and oriented  Airway & Oxygen Therapy: Patient Spontanous Breathing  Post-op Assessment: Report given to RN  Post vital signs: Reviewed and stable  Last Vitals:  Filed Vitals:   09/08/15 2027  BP: 114/68  Pulse: 89  Temp: 36.8 C  Resp: 16    Complications: No apparent anesthesia complications

## 2015-09-08 NOTE — H&P (Signed)
Lisa Wolf is an 35 y.o. female.   Chief Complaint: Right lower quadrant abdominal pain HPI: Patient is a 35 year old white female who started having lower abdominal pain yesterday. She presented to her primary care physician this afternoon with worsening right lower quadrant abdominal pain. CT scan the abdomen was performed and revealed acute appendicitis without perforation.  Past Medical History  Diagnosis Date  . Abnormal Pap smear   . Pregnancy induced hypertension     Past Surgical History  Procedure Laterality Date  . Wisdom tooth extraction    . Cesarean section  06/24/2012    Procedure: CESAREAN SECTION;  Surgeon: Lovenia Kim, MD;  Location: [facility];  Service: Obstetrics;  Laterality: N/A;    Family History  Problem Relation Age of Onset  . Asthma Mother    Social History:  reports that she has been smoking.  She has never used smokeless tobacco. She reports that she does not drink alcohol or use illicit drugs.  Allergies:  Allergies  Allergen Reactions  . Ciprofloxacin     nerulogical symptoms     (Not in a hospital admission)  Results for orders placed or performed during the hospital encounter of 09/08/15 (from the past 48 hour(s))  Basic metabolic panel     Status: Abnormal   Collection Time: 09/08/15  8:35 PM  Result Value Ref Range   Sodium 135 135 - 145 mmol/L   Potassium 3.8 3.5 - 5.1 mmol/L   Chloride 100 (L) 101 - 111 mmol/L   CO2 24 22 - 32 mmol/L   Glucose, Bld 95 65 - 99 mg/dL   BUN 8 6 - 20 mg/dL   Creatinine, Ser 0.61 0.44 - 1.00 mg/dL   Calcium 8.8 (L) 8.9 - 10.3 mg/dL   GFR calc non Af Amer >60 >60 mL/min   GFR calc Af Amer >60 >60 mL/min    Comment: (NOTE) The eGFR has been calculated using the CKD EPI equation. This calculation has not been validated in all clinical situations. eGFR's persistently <60 mL/min signify possible Chronic Kidney Disease.    Anion gap 11 5 - 15  CBC with Differential     Status: Abnormal   Collection  Time: 09/08/15  8:35 PM  Result Value Ref Range   WBC 12.4 (H) 4.0 - 10.5 K/uL   RBC 4.03 3.87 - 5.11 MIL/uL   Hemoglobin 12.5 12.0 - 15.0 g/dL   HCT 36.3 36.0 - 46.0 %   MCV 90.1 78.0 - 100.0 fL   MCH 31.0 26.0 - 34.0 pg   MCHC 34.4 30.0 - 36.0 g/dL   RDW 12.8 11.5 - 15.5 %   Platelets 313 150 - 400 K/uL   Neutrophils Relative % 62 %   Neutro Abs 7.7 1.7 - 7.7 K/uL   Lymphocytes Relative 30 %   Lymphs Abs 3.7 0.7 - 4.0 K/uL   Monocytes Relative 7 %   Monocytes Absolute 0.9 0.1 - 1.0 K/uL   Eosinophils Relative 1 %   Eosinophils Absolute 0.2 0.0 - 0.7 K/uL   Basophils Relative 0 %   Basophils Absolute 0.0 0.0 - 0.1 K/uL   Ct Abdomen Pelvis W Contrast  09/08/2015  CLINICAL DATA:  Right lower quadrant pain since last night. Rule out appendicitis. EXAM: CT ABDOMEN AND PELVIS WITH CONTRAST TECHNIQUE: Multidetector CT imaging of the abdomen and pelvis was performed using the standard protocol following bolus administration of intravenous contrast. CONTRAST:  123m OMNIPAQUE IOHEXOL 300 MG/ML  SOLN COMPARISON:  None. FINDINGS: Lower chest:  The included lung bases are clear. Liver: No focal lesion. Hepatobiliary: Gallbladder physiologically distended, no calcified stone. No biliary dilatation. Pancreas: Normal.  No ductal dilatation or inflammation. Spleen: Normal. Adrenal glands: No nodule. Kidneys: Symmetric renal enhancement. No hydronephrosis. No perinephric stranding or focal renal abnormality Stomach/Bowel: Stomach physiologically distended. There are no dilated or thickened small bowel loops. Small volume of stool throughout the colon. Mild cecal wall thickening is likely secondary to appendiceal inflammation. There is otherwise no colonic wall thickening Appendix: Dilated measuring 11 mm, fluid-filled with moderate surrounding periappendiceal inflammatory change. No perforation or abscess. Vascular/Lymphatic: No retroperitoneal adenopathy. Abdominal aorta is normal in caliber. Reproductive:  Normal CT appearance of the uterus and ovaries. Prominent left adnexal and gonadal vascularity, can be seen with pelvic congestion. Bladder: Physiologically distended, no wall thickening. Other: No free air, free fluid, or intra-abdominal fluid collection. No appendiceal perforation. Musculoskeletal: There are no acute or suspicious osseous abnormalities. IMPRESSION: Uncomplicated acute appendicitis. These results will be called to the ordering clinician or representative by the technologist. Electronically Signed   By: Jeb Levering M.D.   On: 09/08/2015 19:56    Review of Systems  Constitutional: Positive for malaise/fatigue.  HENT: Negative.   Eyes: Negative.   Respiratory: Negative.   Cardiovascular: Negative.   Gastrointestinal: Positive for abdominal pain.  Genitourinary: Negative.   Musculoskeletal: Negative.   Skin: Negative.     Blood pressure 114/68, pulse 89, temperature 98.2 F (36.8 C), temperature source Oral, resp. rate 16, height 5' 6.5" (1.689 m), weight 81.647 kg (180 lb), last menstrual period 08/25/2015, SpO2 98 %, unknown if currently breastfeeding. Physical Exam  Vitals reviewed. Constitutional: She is oriented to person, place, and time. She appears well-developed and well-nourished.  HENT:  Head: Normocephalic and atraumatic.  Neck: Normal range of motion. Neck supple.  Cardiovascular: Normal rate, regular rhythm and normal heart sounds.   Respiratory: Effort normal and breath sounds normal.  GI: Soft. She exhibits no distension. There is tenderness. There is rebound.  Tender in the right lower quadrant to palpation. No rigidity noted.  Neurological: She is alert and oriented to person, place, and time.  Skin: Skin is warm and dry.     Assessment/Plan Impression: Acute appendicitis Plan: Patient be taken to the operating room for laparoscopic appendectomy. The risks and benefits of the procedure including bleeding, infection, and the possibility of an open  procedure were fully explained to the patient, who gave informed consent.  Zoella Roberti A 09/08/2015, 9:21 PM

## 2015-09-08 NOTE — Op Note (Signed)
Patient:  Lisa Wolf  DOB:  March 02, 1981  MRN:  [id number]   Preop Diagnosis:  Acute appendicitis  Postop Diagnosis:  Same   Procedure:  Laparoscopic appendectomy   Surgeon:  Aviva Signs, M.D.  Anes:  Gen. endotracheal  Indications:  Patient is a 35 year old white female who presents with a greater than 24-hour history of worsening right lower quadrant abdominal pain. CT scan the abdomen reveals acute appendicitis. The risks and benefits of the procedure including bleeding, infection, and the possibility of an open procedure were fully explained to the patient, who gave informed consent.  Procedure note:  The patient was placed the supine position. After induction of general endotracheal anesthesia, the abdomen was prepped and draped using usual sterile technique with DuraPrep. Surgical site confirmation was performed.  A supraumbilical incision was made down to the fascia. A Veress needle was introduced into the abdominal cavity and confirmation of placement was done using the saline drop test. The abdomen was then insufflated to 16 mmHg pressure. An 11 mm trocar was introduced into the abdominal cavity under direct visualization without difficulty. The patient was placed in deeper Trendelenburg position and an additional 12 mm trocar was placed the suprapubic region and a 5 mm trocar was placed left lower quadrant region. The appendix was visualized and noted to have a significant amount of inflammation in the periappendiceal fat around it. The mesoappendix was divided using the harmonic scalpel. A standard Endo GIA was placed across the base the appendix and fired. The appendix was then removed using an Endo Catch bag. The staple line was inspected and noted to be within normal limits. The right lower quadrant was then copiously irrigated with normal saline. All fluid and air were then evacuated from the abdominal cavity prior to removal of the trochars.  All wounds were irrigated with  normal saline. All wounds were injected with 0.5% Sensorcaine. The supraumbilical fascia as well as suprapubic fascia were reapproximated using 0 Vicryl interrupted sutures. All skin incisions were closed using staples. Betadine ointment and dry sterile dressings were applied.  All tape and needle counts were correct at the end of the procedure. Patient was extubated in the operating room and transferred to PACU in stable condition.  Complications:  None  EBL:  Minimal  Specimen:  Appendix

## 2015-09-09 ENCOUNTER — Encounter (HOSPITAL_COMMUNITY): Payer: Self-pay

## 2015-09-09 LAB — CBC
HEMATOCRIT: 33.8 % — AB (ref 36.0–46.0)
HEMOGLOBIN: 11.4 g/dL — AB (ref 12.0–15.0)
MCH: 30.9 pg (ref 26.0–34.0)
MCHC: 33.7 g/dL (ref 30.0–36.0)
MCV: 91.6 fL (ref 78.0–100.0)
Platelets: 284 10*3/uL (ref 150–400)
RBC: 3.69 MIL/uL — ABNORMAL LOW (ref 3.87–5.11)
RDW: 12.9 % (ref 11.5–15.5)
WBC: 14 10*3/uL — AB (ref 4.0–10.5)

## 2015-09-09 MED ORDER — ONDANSETRON HCL 4 MG/2ML IJ SOLN: 4.0000 mg | Freq: Four times a day (QID) | INTRAMUSCULAR | Status: DC | PRN

## 2015-09-09 MED ORDER — LACTATED RINGERS IV SOLN: INTRAVENOUS | Status: DC

## 2015-09-09 MED ORDER — ACETAMINOPHEN 325 MG PO TABS: 650.0000 mg | ORAL_TABLET | Freq: Four times a day (QID) | ORAL | Status: DC | PRN

## 2015-09-09 MED ORDER — AMOXICILLIN-POT CLAVULANATE 875-125 MG PO TABS: 1.0000 | ORAL_TABLET | Freq: Two times a day (BID) | ORAL | Status: DC

## 2015-09-09 MED ORDER — ACETAMINOPHEN 650 MG RE SUPP: 650.0000 mg | Freq: Four times a day (QID) | RECTAL | Status: DC | PRN

## 2015-09-09 MED ORDER — DIPHENHYDRAMINE HCL 25 MG PO CAPS
50.0000 mg | ORAL_CAPSULE | ORAL | Status: DC | PRN
  Filled 2015-09-09: qty 2

## 2015-09-09 MED ORDER — ONDANSETRON 4 MG PO TBDP: 4.0000 mg | ORAL_TABLET | Freq: Four times a day (QID) | ORAL | Status: DC | PRN

## 2015-09-09 MED ORDER — PIPERACILLIN-TAZOBACTAM 3.375 G IVPB
3.3750 g | Freq: Three times a day (TID) | INTRAVENOUS | Status: DC
  Administered 2015-09-09: 3.375 g via INTRAVENOUS
  Filled 2015-09-09: qty 50

## 2015-09-09 MED ORDER — ENOXAPARIN SODIUM 40 MG/0.4ML ~~LOC~~ SOLN: 40.0000 mg | SUBCUTANEOUS | Status: DC

## 2015-09-09 MED ORDER — LORAZEPAM 2 MG/ML IJ SOLN: 1.0000 mg | INTRAMUSCULAR | Status: DC | PRN

## 2015-09-09 MED ORDER — HYDROMORPHONE HCL 1 MG/ML IJ SOLN
1.0000 mg | INTRAMUSCULAR | Status: DC | PRN
  Administered 2015-09-09 (×2): 1 mg via INTRAVENOUS
  Filled 2015-09-09 (×2): qty 1

## 2015-09-09 MED ORDER — OXYCODONE-ACETAMINOPHEN 7.5-325 MG PO TABS: 1.0000 | ORAL_TABLET | ORAL | Status: DC | PRN

## 2015-09-09 MED ORDER — OXYCODONE-ACETAMINOPHEN 5-325 MG PO TABS
1.0000 | ORAL_TABLET | ORAL | Status: DC | PRN
  Administered 2015-09-09: 1 via ORAL
  Filled 2015-09-09: qty 1

## 2015-09-09 MED ORDER — SIMETHICONE 80 MG PO CHEW
40.0000 mg | CHEWABLE_TABLET | Freq: Four times a day (QID) | ORAL | Status: DC | PRN
Start: 2015-09-09 — End: 2015-09-09

## 2015-09-09 NOTE — Addendum Note (Signed)
Addendum  created 09/09/15 [id number] by Ollen Bowl, CRNA   Modules edited: Clinical Notes   Clinical Notes:  File: [id number]

## 2015-09-09 NOTE — Discharge Instructions (Signed)
Laparoscopic Appendectomy, Adult, Care After °Refer to this sheet in the next few weeks. These instructions provide you with information on caring for yourself after your procedure. Your caregiver may also give you more specific instructions. Your treatment has been planned according to current medical practices, but problems sometimes occur. Call your caregiver if you have any problems or questions after your procedure. °HOME CARE INSTRUCTIONS °· Do not drive while taking narcotic pain medicines. °· Use stool softener if you become constipated from your pain medicines. °· Change your bandages (dressings) as directed. °· Keep your wounds clean and dry. You may wash the wounds gently with soap and water. Gently pat the wounds dry with a clean towel. °· Do not take baths, swim, or use hot tubs for 10 days, or as instructed by your caregiver. °· Only take over-the-counter or prescription medicines for pain, discomfort, or fever as directed by your caregiver. °· You may continue your normal diet as directed. °· Do not lift more than 10 pounds (4.5 kg) or play contact sports for 3 weeks, or as directed. °· Slowly increase your activity after surgery. °· Take deep breaths to avoid getting a lung infection (pneumonia). °SEEK MEDICAL CARE IF: °· You have redness, swelling, or increasing pain in your wounds. °· You have pus coming from your wounds. °· You have drainage from a wound that lasts longer than 1 day. °· You notice a bad smell coming from the wounds or dressing. °· Your wound edges break open after stitches (sutures) have been removed. °· You notice increasing pain in the shoulders (shoulder strap areas) or near your shoulder blades. °· You develop dizzy episodes or fainting while standing. °· You develop shortness of breath. °· You develop persistent nausea or vomiting. °· You cannot control your bowel functions or lose your appetite. °· You develop diarrhea. °SEEK IMMEDIATE MEDICAL CARE IF:  °· You have a  fever. °· You develop a rash. °· You have difficulty breathing or sharp pains in your chest. °· You develop any reaction or side effects to medicines given. °MAKE SURE YOU: °· Understand these instructions. °· Will watch your condition. °· Will get help right away if you are not doing well or get worse. °  °This information is not intended to replace advice given to you by your health care provider. Make sure you discuss any questions you have with your health care provider. °  °Document Released: 08/13/2005 Document Revised: 12/28/2014 Document Reviewed: 01/31/2015 °Elsevier Interactive Patient Education ©2016 [employer] ° °

## 2015-09-09 NOTE — Discharge Summary (Signed)
Physician Discharge Summary  Patient ID: Lisa Wolf MRN: [id number] DOB/AGE: [birth year] 35 y.o.  Admit date: 09/08/2015 Discharge date: 09/09/2015  Admission Diagnoses:acute appendicitis  Discharge Diagnoses: same  Active Problems:   Acute appendicitis   Discharged Condition: good  Hospital Course: Patient is a 35yo wf who presented to her primary care physician with right lower quadrant abdominal pain.  Ct scan was ordered which revealed acute appendicitis.  She underwent a laparoscopic appendectomy on 09/08/15.  She tolerated the procedure well.  Her postoperative course was unremarkable.  She is being discharged home in good and improving condition.  Treatments: surgery: Laparoscopic appendectomy on 09/08/15  Discharge Exam: Blood pressure 98/56, pulse 72, temperature 98.4 F (36.9 C), temperature source Oral, resp. rate 18, height 5' 6.5" (1.689 m), weight 81.3 kg (179 lb 3.7 oz), last menstrual period 08/25/2015, SpO2 96 %, unknown if currently breastfeeding. General appearance: alert, cooperative and no distress Resp: clear to auscultation bilaterally Cardio: regular rate and rhythm, S1, S2 normal, no murmur, click, rub or gallop GI: soft, incisions healing well.  Disposition: 01-Home or Self Care     Medication List    TAKE these medications        ADVIL COLD & SINUS LIQUI-GELS 30-200 MG Caps  Generic drug:  Pseudoephedrine-Ibuprofen  Take 1-2 capsules by mouth daily as needed (for congestion).     amoxicillin-clavulanate 875-125 MG tablet  Commonly known as:  AUGMENTIN  Take 1 tablet by mouth 2 (two) times daily.     diphenhydrAMINE 25 MG tablet  Commonly known as:  BENADRYL  Take 50 mg by mouth every 4 (four) hours as needed for itching or allergies.     ibuprofen 200 MG tablet  Commonly known as:  ADVIL,MOTRIN  Take 200 mg by mouth every 6 (six) hours as needed for mild pain or moderate pain.     mupirocin ointment 2 %  Commonly known as:  BACTROBAN   Apply 1 application topically daily.     oxyCODONE-acetaminophen 7.5-325 MG tablet  Commonly known as:  PERCOCET  Take 1-2 tablets by mouth every 4 (four) hours as needed.     predniSONE 50 MG tablet  Commonly known as:  DELTASONE  Take 1 tablet (50 mg total) by mouth daily.     TRI-SPRINTEC 0.18/0.215/0.25 MG-35 MCG tablet  Generic drug:  Norgestimate-Ethinyl Estradiol Triphasic  Take 1 tablet by mouth daily.           Follow-up Information    Follow up with Jamesetta So, MD. Schedule an appointment as soon as possible for a visit on 09/15/2015.   Specialty:  General Surgery   Contact information:   [id number] [phone]       Signed: Aviva Signs A 09/09/2015, 9:23 AM

## 2015-09-09 NOTE — Anesthesia Postprocedure Evaluation (Signed)
Anesthesia Post Note  Patient: Lisa Wolf  Procedure(s) Performed: Procedure(s) (LRB): APPENDECTOMY LAPAROSCOPIC (N/A)  Patient location during evaluation: Nursing Unit Anesthesia Type: General Level of consciousness: awake and alert and oriented Pain management: pain level controlled Vital Signs Assessment: post-procedure vital signs reviewed and stable Respiratory status: spontaneous breathing Cardiovascular status: blood pressure returned to baseline Postop Assessment: adequate PO intake Anesthetic complications: no    Last Vitals:  Filed Vitals:   09/09/15 0417 09/09/15 0835  BP: 91/53 98/56  Pulse: 89 72  Temp: 37 C 36.9 C  Resp: 16 18    Last Pain:  Filed Vitals:   09/09/15 0836  PainSc: 1                  Trea Carnegie

## 2015-09-09 NOTE — Progress Notes (Signed)
Patient discharged with instructions, prescription, and care notes.  Verbalized understanding via teach back.  IV was removed and the site was WNL. Patient voiced no further complaints or concerns at the time of discharge.  Appointments scheduled per instructions.  Patient left the floor via w/c with staff and family in stable condition. 

## 2017-06-17 ENCOUNTER — Ambulatory Visit (HOSPITAL_COMMUNITY)
Admission: RE | Admit: 2017-06-17 | Discharge: 2017-06-17 | Disposition: A | Discharge status: Home / Self Care | Payer: Commercial Managed Care - PPO | Source: Ambulatory Visit | Attending: Internal Medicine | Admitting: Internal Medicine

## 2017-06-17 ENCOUNTER — Other Ambulatory Visit (HOSPITAL_COMMUNITY): Payer: Self-pay | Admitting: Internal Medicine

## 2017-06-17 DIAGNOSIS — R079 Chest pain, unspecified: Secondary | ICD-10-CM

## 2017-06-17 DIAGNOSIS — R0789 Other chest pain: Secondary | ICD-10-CM | POA: Diagnosis present

## 2019-02-06 ENCOUNTER — Other Ambulatory Visit (HOSPITAL_COMMUNITY): Payer: Self-pay | Admitting: Internal Medicine

## 2019-02-06 ENCOUNTER — Other Ambulatory Visit: Payer: Self-pay | Admitting: Internal Medicine

## 2019-02-06 DIAGNOSIS — R1010 Upper abdominal pain, unspecified: Secondary | ICD-10-CM

## 2019-02-13 ENCOUNTER — Other Ambulatory Visit: Payer: Self-pay

## 2019-02-13 ENCOUNTER — Ambulatory Visit (HOSPITAL_COMMUNITY)
Admission: RE | Admit: 2019-02-13 | Discharge: 2019-02-13 | Disposition: A | Discharge status: Home / Self Care | Payer: Commercial Managed Care - PPO | Source: Ambulatory Visit | Attending: Internal Medicine | Admitting: Internal Medicine

## 2019-02-13 DIAGNOSIS — R1010 Upper abdominal pain, unspecified: Secondary | ICD-10-CM | POA: Diagnosis not present

## 2019-02-19 ENCOUNTER — Other Ambulatory Visit (HOSPITAL_COMMUNITY): Payer: Self-pay | Admitting: Internal Medicine

## 2019-02-19 ENCOUNTER — Other Ambulatory Visit: Payer: Self-pay | Admitting: Internal Medicine

## 2019-02-20 ENCOUNTER — Other Ambulatory Visit (HOSPITAL_COMMUNITY): Payer: Self-pay | Admitting: Internal Medicine

## 2019-02-20 DIAGNOSIS — R1084 Generalized abdominal pain: Secondary | ICD-10-CM

## 2019-02-26 ENCOUNTER — Encounter (HOSPITAL_COMMUNITY): Payer: Self-pay

## 2019-02-26 ENCOUNTER — Encounter (HOSPITAL_COMMUNITY)
Admission: RE | Admit: 2019-02-26 | Discharge: 2019-02-26 | Disposition: A | Discharge status: Home / Self Care | Payer: Commercial Managed Care - PPO | Source: Ambulatory Visit | Attending: Internal Medicine | Admitting: Internal Medicine

## 2019-02-26 ENCOUNTER — Other Ambulatory Visit: Payer: Self-pay

## 2019-02-26 DIAGNOSIS — R1084 Generalized abdominal pain: Secondary | ICD-10-CM

## 2019-02-26 MED ORDER — TECHNETIUM TC 99M MEBROFENIN IV KIT
5.0000 | PACK | Freq: Once | INTRAVENOUS | Status: AC | PRN
  Administered 2019-02-26: 5.25 via INTRAVENOUS

## 2019-05-28 HISTORY — PX: CHOLECYSTECTOMY: SHX55

## 2019-06-23 ENCOUNTER — Other Ambulatory Visit: Payer: Self-pay | Admitting: General Surgery

## 2019-09-20 DIAGNOSIS — U071 COVID-19: Secondary | ICD-10-CM

## 2019-09-20 HISTORY — DX: COVID-19: U07.1

## 2019-10-12 ENCOUNTER — Other Ambulatory Visit: Payer: Self-pay

## 2019-10-12 ENCOUNTER — Encounter: Payer: Self-pay | Admitting: Orthopedic Surgery

## 2019-10-12 ENCOUNTER — Ambulatory Visit: Payer: Commercial Managed Care - PPO

## 2019-10-12 ENCOUNTER — Ambulatory Visit (INDEPENDENT_AMBULATORY_CARE_PROVIDER_SITE_OTHER): Payer: Commercial Managed Care - PPO | Admitting: Orthopedic Surgery

## 2019-10-12 VITALS — BP 130/72 | HR 97 | Ht 67.0 in | Wt 193.0 lb

## 2019-10-12 DIAGNOSIS — M25511 Pain in right shoulder: Secondary | ICD-10-CM

## 2019-10-12 DIAGNOSIS — G8929 Other chronic pain: Secondary | ICD-10-CM

## 2019-10-12 DIAGNOSIS — M7711 Lateral epicondylitis, right elbow: Secondary | ICD-10-CM

## 2019-10-12 DIAGNOSIS — M7710 Lateral epicondylitis, unspecified elbow: Secondary | ICD-10-CM

## 2019-10-12 DIAGNOSIS — Z720 Tobacco use: Secondary | ICD-10-CM | POA: Diagnosis not present

## 2019-10-12 HISTORY — DX: Lateral epicondylitis, unspecified elbow: M77.10

## 2019-10-12 NOTE — Progress Notes (Signed)
NEW PROBLEM//OFFICE VISIT  Chief Complaint  Patient presents with  . Shoulder Pain    right painful ROM and painful at night   . Arm Pain    right/ hand forearm tingling denies neck pain     39 year old female presents with right shoulder and right arm pain associated with numbness and tingling in the right upper extremity with painful range of motion of the right shoulder  Positive smoking history  She is on etodolac and Flexeril 10 mg  Further questioning reveals the pain is actually in the forearm and is exacerbated by picking things up and wrist extension.  Pain for 2 months   Review of Systems  Musculoskeletal: Positive for back pain and joint pain.  Neurological: Positive for headaches.  All other systems reviewed and are negative.    Past Medical History:  Diagnosis Date  . Abnormal Pap smear   . Pregnancy induced hypertension     Past Surgical History:  Procedure Laterality Date  . CESAREAN SECTION  06/24/2012   Procedure: CESAREAN SECTION;  Surgeon: Lovenia Kim, MD;  Location: [facility];  Service: Obstetrics;  Laterality: N/A;  . LAPAROSCOPIC APPENDECTOMY N/A 09/08/2015   Procedure: APPENDECTOMY LAPAROSCOPIC;  Surgeon: Aviva Signs, MD;  Location: [facility];  Service: General;  Laterality: N/A;  . WISDOM TOOTH EXTRACTION      Family History  Problem Relation Age of Onset  . Asthma Mother    Social History   Tobacco Use  . Smoking status: Light Tobacco Smoker  . Smokeless tobacco: Never Used  Substance Use Topics  . Alcohol use: No  . Drug use: No    Allergies  Allergen Reactions  . Ciprofloxacin     nerulogical symptoms    Current Meds  Medication Sig  . cyclobenzaprine (FLEXERIL) 10 MG tablet Take 10 mg by mouth at bedtime.  Marland Kitchen etodolac (LODINE) 300 MG capsule Take 300 mg by mouth 2 (two) times daily.  Arna Medici 125 MCG tablet   . ibuprofen (ADVIL,MOTRIN) 200 MG tablet Take 200 mg by mouth every 6 (six) hours as needed for mild pain or  moderate pain.  Marland Kitchen norethindrone (MICRONOR) 0.35 MG tablet Take 1 tablet by mouth daily.  . TRI-SPRINTEC 0.18/0.215/0.25 MG-35 MCG tablet Take 1 tablet by mouth daily.    BP 130/72   Pulse 97   Ht 5\' 7"  (1.702 m)   Wt 193 lb (87.5 kg)   LMP 09/28/2019 (Approximate)   BMI 30.23 kg/m   Physical Exam Vitals and nursing note reviewed.  Constitutional:      Appearance: Normal appearance.  Musculoskeletal:     Right shoulder: Normal.     Left shoulder: Normal.       Arms:  Neurological:     Mental Status: She is alert and oriented to person, place, and time.  Psychiatric:        Mood and Affect: Mood normal.    Cervical spine nontender shoulder exam unremarkable     MEDICAL DECISION MAKING  A.  Encounter Diagnoses  Name Primary?  . Tobacco use Yes  . Right shoulder pain, unspecified chronicity   . Right tennis elbow     B. DATA ANALYSED:   IMAGING: Independent interpretation of images: X-ray in the office of the shoulder was normal  Orders: None  Outside records reviewed: None  C. MANAGEMENT  Inject right tennis elbow Tennis elbow brace Tennis elbow exercises for 6 weeks Follow-up as needed  No orders of the defined types were  placed in this encounter.     Arther Abbott, MD  10/12/2019 9:09 AM

## 2019-10-12 NOTE — Patient Instructions (Addendum)
Smoking Tobacco Information, Adult Smoking tobacco can be harmful to your health. Tobacco contains a poisonous (toxic), colorless chemical called nicotine. Nicotine is addictive. It changes the brain and can make it hard to stop smoking. Tobacco also has other toxic chemicals that can hurt your body and raise your risk of many cancers. How can smoking tobacco affect me? Smoking tobacco puts you at risk for:  Cancer. Smoking is most commonly associated with lung cancer, but can also lead to cancer in other parts of the body.  Chronic obstructive pulmonary disease (COPD). This is a long-term lung condition that makes it hard to breathe. It also gets worse over time.  High blood pressure (hypertension), heart disease, stroke, or heart attack.  Lung infections, such as pneumonia.  Cataracts. This is when the lenses in the eyes become clouded.  Digestive problems. This may include peptic ulcers, heartburn, and gastroesophageal reflux disease (GERD).  Oral health problems, such as gum disease and tooth loss.  Loss of taste and smell. Smoking can affect your appearance by causing:  Wrinkles.  Yellow or stained teeth, fingers, and fingernails. Smoking tobacco can also affect your social life, because:  It may be challenging to find places to smoke when away from home. Many workplaces, restaurants, hotels, and public places are tobacco-free.  Smoking is expensive. This is due to the cost of tobacco and the long-term costs of treating health problems from smoking.  Secondhand smoke may affect those around you. Secondhand smoke can cause lung cancer, breathing problems, and heart disease. Children of smokers have a higher risk for: ? Sudden infant death syndrome (SIDS). ? Ear infections. ? Lung infections. If you currently smoke tobacco, quitting now can help you:  Lead a longer and healthier life.  Look, smell, breathe, and feel better over time.  Save money.  Protect others from the  harms of secondhand smoke. What actions can I take to prevent health problems? Quit smoking   Do not start smoking. Quit if you already do.  Make a plan to quit smoking and commit to it. Look for programs to help you and ask your health care provider for recommendations and ideas.  Set a date and write down all the reasons you want to quit.  Let your friends and family know you are quitting so they can help and support you. Consider finding friends who also want to quit. It can be easier to quit with someone else, so that you can support each other.  Talk with your health care provider about using nicotine replacement medicines to help you quit, such as gum, lozenges, patches, sprays, or pills.  Do not replace cigarette smoking with electronic cigarettes, which are commonly called e-cigarettes. The safety of e-cigarettes is not known, and some may contain harmful chemicals.  If you try to quit but return to smoking, stay positive. It is common to slip up when you first quit, so take it one day at a time.  Be prepared for cravings. When you feel the urge to smoke, chew gum or suck on hard candy. Lifestyle  Stay busy and take care of your body.  Drink enough fluid to keep your urine pale yellow.  Get plenty of exercise and eat a healthy diet. This can help prevent weight gain after quitting.  Monitor your eating habits. Quitting smoking can cause you to have a larger appetite than when you smoke.  Find ways to relax. Go out with friends or family to a movie or a restaurant   where people do not smoke.  Ask your health care provider about having regular tests (screenings) to check for cancer. This may include blood tests, imaging tests, and other tests.  Find ways to manage your stress, such as meditation, yoga, or exercise. Where to find support To get support to quit smoking, consider:  Asking your health care provider for more information and resources.  Taking classes to learn  more about quitting smoking.  Looking for local organizations that offer resources about quitting smoking.  Joining a support group for people who want to quit smoking in your local community.  Calling the smokefree.gov counselor helpline: 1-800-Quit-Now 651-548-2187) Where to find more information You may find more information about quitting smoking from:  HelpGuide.org: www.helpguide.org  https://hall.com/: smokefree.gov  American Lung Association: www.lung.org Contact a health care provider if you:  Have problems breathing.  Notice that your lips, nose, or fingers turn blue.  Have chest pain.  Are coughing up blood.  Feel faint or you pass out.  Have other health changes that cause you to worry. Summary  Smoking tobacco can negatively affect your health, the health of those around you, your finances, and your social life.  Do not start smoking. Quit if you already do. If you need help quitting, ask your health care provider.  Think about joining a support group for people who want to quit smoking in your local community. There are many effective programs that will help you to quit this behavior. This information is not intended to replace advice given to you by your health care provider. Make sure you discuss any questions you have with your health care provider. Document Revised: 05/08/2019 Document Reviewed: 08/28/2016 Elsevier Patient Education  [place].  Tennis Elbow Tennis elbow is swelling (inflammation) in your outer forearm, near your elbow. Swelling affects the tissues that connect muscle to bone (tendons). Tennis elbow can happen in any sport or job in which you use your elbow too much. It is caused by doing the same motion over and over. Tennis elbow can cause:  Pain and tenderness in your forearm and the outer part of your elbow. You may have pain all the time, or only when using the arm.  A burning feeling. This runs from your elbow through your  arm.  Weak grip in your hand. Follow these instructions at home: Activity  Rest your elbow and wrist. Avoid activities that cause problems, as told by your doctor.  If told by your doctor, wear an elbow strap to reduce stress on the area.  Do physical therapy exercises as told.  If you lift an object, lift it with your palm facing up. This is easier on your elbow. Lifestyle  If your tennis elbow is caused by sports, check your equipment and make sure that: ? You are using it correctly. ? It fits you well.  If your tennis elbow is caused by work or by using a computer, take breaks often to stretch your arm. Talk with your manager about how you can manage your condition at work. If you have a brace:  Wear the brace as told by your doctor. Remove it only as told by your doctor.  Loosen the brace if your fingers tingle, get numb, or turn cold and blue.  Keep the brace clean.  If the brace is not waterproof, ask your doctor if you may take the brace off for bathing. If you must keep the brace on while bathing: ? Do not let it  get wet. ? Cover it with a watertight covering when you take a bath or a shower. General instructions   If told, put ice on the painful area: ? Put ice in a plastic bag. ? Place a towel between your skin and the bag. ? Leave the ice on for 20 minutes, 2-3 times a day.  Take over-the-counter and prescription medicines only as told by your doctor.  Keep all follow-up visits as told by your doctor. This is important. Contact a doctor if:  Your pain does not get better with treatment.  Your pain gets worse.  You have weakness in your forearm, hand, or fingers.  You cannot feel your forearm, hand, or fingers. Summary  Tennis elbow is swelling (inflammation) in your outer forearm, near your elbow.  Tennis elbow is caused by doing the same motion over and over.  Rest your elbow and wrist. Avoid activities that cause problems, as told by your  doctor.  If told, put ice on the painful area for 20 minutes, 2-3 times a day. This information is not intended to replace advice given to you by your health care provider. Make sure you discuss any questions you have with your health care provider. Document Revised: 05/09/2018 Document Reviewed: 05/28/2017 Elsevier Patient Education  [place].

## 2019-12-03 ENCOUNTER — Ambulatory Visit
Admission: EM | Admit: 2019-12-03 | Discharge: 2019-12-03 | Disposition: A | Discharge status: Home / Self Care | Payer: Commercial Managed Care - PPO | Attending: Emergency Medicine | Admitting: Emergency Medicine

## 2019-12-03 DIAGNOSIS — T7840XA Allergy, unspecified, initial encounter: Secondary | ICD-10-CM

## 2019-12-03 DIAGNOSIS — R21 Rash and other nonspecific skin eruption: Secondary | ICD-10-CM

## 2019-12-03 MED ORDER — FAMOTIDINE 20 MG PO TABS
20.0000 mg | ORAL_TABLET | Freq: Once | ORAL | Status: AC
  Administered 2019-12-03: 14:00:00 20 mg via ORAL

## 2019-12-03 MED ORDER — PREDNISONE 20 MG PO TABS: 20.0000 mg | ORAL_TABLET | Freq: Two times a day (BID) | ORAL | 0 refills | Status: AC

## 2019-12-03 MED ORDER — DEXAMETHASONE SODIUM PHOSPHATE 10 MG/ML IJ SOLN
10.0000 mg | Freq: Once | INTRAMUSCULAR | Status: AC
  Administered 2019-12-03: 14:00:00 10 mg via INTRAMUSCULAR

## 2019-12-03 NOTE — ED Triage Notes (Addendum)
Pt presents with bright red rash to lower legs bilaterally that started this morning going up to her knee reports it feels tingly, tight and hot. Denies itching. Reports taking etodolac last night and this morning for knee pain.  Reports the same thing happened the last time she took etodolac but it did not happen for a month after she took it.

## 2019-12-03 NOTE — Discharge Instructions (Addendum)
Decadron 10 mg shot given in office Famotidine 20 mg given in office.   Rest push fluids Return or follow up with PCP in 24 hours to be reevaluated and to ensure your symptoms are improving Prednisone 40 mg daily for 3 days prescribed.  Take as directed and to completion. Use OTC benadryl 25 mg prescribed.  Take as directed for 3 days. Use OTC pepcid 20 mg twice daily for 3 days Return sooner or go to the ED if you have any new or worsening symptoms such as difficulty breathing, shortness of breath, chest pain, nausea, vomiting, throat tightness or swelling, tongue swelling or tingling, worsening lip or facial swelling, abdominal pain, changes in bowel or bladder habits, no improvement despite medications, etc..Marland Kitchen

## 2019-12-03 NOTE — ED Provider Notes (Signed)
[facility]   [id number] 12/03/19 Arrival Time: [id number]  Cc: Allergic reaction  SUBJECTIVE:  Lisa Wolf is a 39 y.o. female who presents with possible allergic reaction that began this morning.  Describes reaction as bilateral LE redness, swelling, redness and tingling.  Symptoms began after taking etodolac.  Has NOT tried OTC medication.  Denies aggravating factors.  Reports previous symptoms in the past possibly related to taking etodolac.   Denies fever, chills, nausea, vomiting, swollen glands, oral manifestations such as throat swelling/ tingling, mouth swelling/ tingling, tongue swelling/tingling, dyspnea, SOB, chest pain, abdominal pain, changes in bowel or bladder function.     ROS: As per HPI.  All other pertinent ROS negative.     Past Medical History:  Diagnosis Date  . Abnormal Pap smear   . Pregnancy induced hypertension    Past Surgical History:  Procedure Laterality Date  . CESAREAN SECTION  06/24/2012   Procedure: CESAREAN SECTION;  Surgeon: Lovenia Kim, MD;  Location: [facility];  Service: Obstetrics;  Laterality: N/A;  . LAPAROSCOPIC APPENDECTOMY N/A 09/08/2015   Procedure: APPENDECTOMY LAPAROSCOPIC;  Surgeon: Aviva Signs, MD;  Location: [facility];  Service: General;  Laterality: N/A;  . WISDOM TOOTH EXTRACTION     Allergies  Allergen Reactions  . Ciprofloxacin     nerulogical symptoms   No current facility-administered medications on file prior to encounter.   Current Outpatient Medications on File Prior to Encounter  Medication Sig Dispense Refill  . etodolac (LODINE) 300 MG capsule Take 300 mg by mouth 2 (two) times daily.    Arna Medici 125 MCG tablet     . norethindrone (MICRONOR) 0.35 MG tablet Take 1 tablet by mouth daily.    . [DISCONTINUED] TRI-SPRINTEC 0.18/0.215/0.25 MG-35 MCG tablet Take 1 tablet by mouth daily.  10    Social History   Socioeconomic History  . Marital status: Married    Spouse name: Not on file  . Number of children:  Not on file  . Years of education: Not on file  . Highest education level: Not on file  Occupational History  . Not on file  Tobacco Use  . Smoking status: Light Tobacco Smoker  . Smokeless tobacco: Never Used  Substance and Sexual Activity  . Alcohol use: No  . Drug use: No  . Sexual activity: Not on file  Other Topics Concern  . Not on file  Social History Narrative  . Not on file   Social Determinants of Health   Financial Resource Strain:   . Difficulty of Paying Living Expenses:   Food Insecurity:   . Worried About Charity fundraiser in the Last Year:   . Arboriculturist in the Last Year:   Transportation Needs:   . Film/video editor (Medical):   Marland Kitchen Lack of Transportation (Non-Medical):   Physical Activity:   . Days of Exercise per Week:   . Minutes of Exercise per Session:   Stress:   . Feeling of Stress :   Social Connections:   . Frequency of Communication with Friends and Family:   . Frequency of Social Gatherings with Friends and Family:   . Attends Religious Services:   . Active Member of Clubs or Organizations:   . Attends Archivist Meetings:   Marland Kitchen Marital Status:   Intimate Partner Violence:   . Fear of Current or Ex-Partner:   . Emotionally Abused:   Marland Kitchen Physically Abused:   . Sexually Abused:  Family History  Problem Relation Age of Onset  . Asthma Mother   . Healthy Father      OBJECTIVE:  Vitals:   12/03/19 1341  BP: 109/70  Pulse: 84  Resp: 18  Temp: 98.3 F (36.8 C)  SpO2: 98%    General appearance: Alert, speaking in full sentences without difficulty HEENT:NCAT; no obvious facial swelling; Ears: EACs clear, TMs pearly gray; Eyes: PERRL.  EOM grossly intact. Nose: nares patent without rhinorrhea; Throat: tonsils nonerythematous or enlarged, uvula midline Neck: supple without LAD Lungs: clear to auscultation bilaterally without adventitious breath sounds; normal respiratory effort; no labored respirations Heart:  regular rate and rhythm.  Radial pulses 2+ symmetrical bilaterally Abdomen: soft, nondistended, normal active bowel sounds; nontender to palpation; no guarding  Skin: warm and dry Psychological: alert and cooperative; normal mood and affect  ASSESSMENT & PLAN:  1. Allergic reaction, initial encounter   2. Rash and nonspecific skin eruption     Meds ordered this encounter  Medications  . dexamethasone (DECADRON) injection 10 mg  . famotidine (PEPCID) tablet 20 mg  . predniSONE (DELTASONE) 20 MG tablet    Sig: Take 1 tablet (20 mg total) by mouth 2 (two) times daily with a meal for 5 days.    Dispense:  10 tablet    Refill:  0    Order Specific Question:   Supervising Provider    Answer:   Raylene Everts [id number]   Decadron 10 mg shot given in office Famotidine 20 mg given in office.   Rest push fluids Return or follow up with PCP in 24 hours to be reevaluated and to ensure your symptoms are improving Prednisone 40 mg daily for 3 days prescribed.  Take as directed and to completion. Use OTC benadryl 25 mg prescribed.  Take as directed for 3 days. Use OTC pepcid 20 mg twice daily for 3 days Return sooner or go to the ED if you have any new or worsening symptoms such as difficulty breathing, shortness of breath, chest pain, nausea, vomiting, throat tightness or swelling, tongue swelling or tingling, worsening lip or facial swelling, abdominal pain, changes in bowel or bladder habits, no improvement despite medications, etc...   Reviewed expectations re: course of current medical issues. Questions answered. Outlined signs and symptoms indicating need for more acute intervention. Patient verbalized understanding. After Visit Summary given.          Lestine Box, PA-C 12/03/19 1356

## 2020-05-23 ENCOUNTER — Other Ambulatory Visit: Payer: Self-pay | Admitting: Obstetrics and Gynecology

## 2020-06-10 ENCOUNTER — Other Ambulatory Visit: Payer: Self-pay

## 2020-06-10 ENCOUNTER — Encounter (HOSPITAL_BASED_OUTPATIENT_CLINIC_OR_DEPARTMENT_OTHER): Payer: Self-pay | Admitting: Obstetrics and Gynecology

## 2020-06-10 NOTE — Progress Notes (Signed)
Spoke w/ via phone for pre-op interview---pt Lab needs dos----  Cbc , urine poct              Lab results------none COVID test ------06-13-2020 1500 Arrive at -------730 am 06-16-2020 NPO after MN NO Solid Food.  Clear liquids from MN until---630 am then npo Medications to take morning of surgery -----norethidrone, euthyrox Diabetic medication -----n/a Patient Special Instructions -----none Pre-Op special Istructions -----none Patient verbalized understanding of instructions that were given at this phone interview. Patient denies shortness of breath, chest pain, fever, cough at this phone interview.

## 2020-06-13 ENCOUNTER — Other Ambulatory Visit (HOSPITAL_COMMUNITY)
Admission: RE | Admit: 2020-06-13 | Discharge: 2020-06-13 | Disposition: A | Discharge status: Home / Self Care | Payer: Commercial Managed Care - PPO | Source: Ambulatory Visit | Attending: Obstetrics and Gynecology | Admitting: Obstetrics and Gynecology

## 2020-06-13 DIAGNOSIS — Z01812 Encounter for preprocedural laboratory examination: Secondary | ICD-10-CM | POA: Diagnosis present

## 2020-06-13 DIAGNOSIS — Z20822 Contact with and (suspected) exposure to covid-19: Secondary | ICD-10-CM | POA: Insufficient documentation

## 2020-06-13 LAB — SARS CORONAVIRUS 2 (TAT 6-24 HRS): SARS Coronavirus 2: NEGATIVE

## 2020-06-16 ENCOUNTER — Ambulatory Visit (HOSPITAL_BASED_OUTPATIENT_CLINIC_OR_DEPARTMENT_OTHER): Payer: Commercial Managed Care - PPO | Admitting: Anesthesiology

## 2020-06-16 ENCOUNTER — Encounter (HOSPITAL_BASED_OUTPATIENT_CLINIC_OR_DEPARTMENT_OTHER)
Admission: RE | Disposition: A | Discharge status: Home / Self Care | Payer: Self-pay | Source: Ambulatory Visit | Attending: Obstetrics and Gynecology

## 2020-06-16 ENCOUNTER — Encounter (HOSPITAL_BASED_OUTPATIENT_CLINIC_OR_DEPARTMENT_OTHER): Payer: Self-pay | Admitting: Obstetrics and Gynecology

## 2020-06-16 ENCOUNTER — Ambulatory Visit (HOSPITAL_BASED_OUTPATIENT_CLINIC_OR_DEPARTMENT_OTHER)
Admission: RE | Admit: 2020-06-16 | Discharge: 2020-06-16 | Disposition: A | Discharge status: Home / Self Care | Payer: Commercial Managed Care - PPO | Source: Ambulatory Visit | Attending: Obstetrics and Gynecology | Admitting: Obstetrics and Gynecology

## 2020-06-16 ENCOUNTER — Other Ambulatory Visit: Payer: Self-pay

## 2020-06-16 DIAGNOSIS — D250 Submucous leiomyoma of uterus: Secondary | ICD-10-CM | POA: Insufficient documentation

## 2020-06-16 DIAGNOSIS — K219 Gastro-esophageal reflux disease without esophagitis: Secondary | ICD-10-CM | POA: Insufficient documentation

## 2020-06-16 DIAGNOSIS — G43909 Migraine, unspecified, not intractable, without status migrainosus: Secondary | ICD-10-CM | POA: Insufficient documentation

## 2020-06-16 DIAGNOSIS — Z9049 Acquired absence of other specified parts of digestive tract: Secondary | ICD-10-CM | POA: Diagnosis not present

## 2020-06-16 DIAGNOSIS — Z8616 Personal history of COVID-19: Secondary | ICD-10-CM | POA: Diagnosis not present

## 2020-06-16 DIAGNOSIS — Z825 Family history of asthma and other chronic lower respiratory diseases: Secondary | ICD-10-CM | POA: Insufficient documentation

## 2020-06-16 DIAGNOSIS — N921 Excessive and frequent menstruation with irregular cycle: Secondary | ICD-10-CM | POA: Insufficient documentation

## 2020-06-16 DIAGNOSIS — Z881 Allergy status to other antibiotic agents status: Secondary | ICD-10-CM | POA: Diagnosis not present

## 2020-06-16 DIAGNOSIS — F17210 Nicotine dependence, cigarettes, uncomplicated: Secondary | ICD-10-CM | POA: Diagnosis not present

## 2020-06-16 HISTORY — PX: DILATATION & CURETTAGE/HYSTEROSCOPY WITH MYOSURE: SHX6511

## 2020-06-16 HISTORY — DX: Gastro-esophageal reflux disease without esophagitis: K21.9

## 2020-06-16 HISTORY — DX: Myoneural disorder, unspecified: G70.9

## 2020-06-16 HISTORY — DX: Migraine, unspecified, not intractable, without status migrainosus: G43.909

## 2020-06-16 LAB — CBC
HCT: 40.5 % (ref 36.0–46.0)
Hemoglobin: 13.9 g/dL (ref 12.0–15.0)
MCH: 32.4 pg (ref 26.0–34.0)
MCHC: 34.3 g/dL (ref 30.0–36.0)
MCV: 94.4 fL (ref 80.0–100.0)
Platelets: 343 10*3/uL (ref 150–400)
RBC: 4.29 MIL/uL (ref 3.87–5.11)
RDW: 12.7 % (ref 11.5–15.5)
WBC: 6.8 10*3/uL (ref 4.0–10.5)
nRBC: 0 % (ref 0.0–0.2)

## 2020-06-16 LAB — POCT PREGNANCY, URINE: Preg Test, Ur: NEGATIVE

## 2020-06-16 SURGERY — DILATATION & CURETTAGE/HYSTEROSCOPY WITH MYOSURE
Anesthesia: General | Site: Vagina

## 2020-06-16 MED ORDER — CEFAZOLIN SODIUM-DEXTROSE 2-4 GM/100ML-% IV SOLN
2.0000 g | INTRAVENOUS | Status: AC
  Administered 2020-06-16: 2 g via INTRAVENOUS

## 2020-06-16 MED ORDER — LIDOCAINE 2% (20 MG/ML) 5 ML SYRINGE
INTRAMUSCULAR | Status: AC
  Filled 2020-06-16: qty 5

## 2020-06-16 MED ORDER — CEFAZOLIN SODIUM-DEXTROSE 2-4 GM/100ML-% IV SOLN
INTRAVENOUS | Status: AC
  Filled 2020-06-16: qty 100

## 2020-06-16 MED ORDER — KETOROLAC TROMETHAMINE 30 MG/ML IJ SOLN
INTRAMUSCULAR | Status: AC
  Filled 2020-06-16: qty 1

## 2020-06-16 MED ORDER — KETOROLAC TROMETHAMINE 30 MG/ML IJ SOLN: 30.0000 mg | Freq: Once | INTRAMUSCULAR | Status: DC | PRN

## 2020-06-16 MED ORDER — ONDANSETRON HCL 4 MG/2ML IJ SOLN
INTRAMUSCULAR | Status: AC
  Filled 2020-06-16: qty 2

## 2020-06-16 MED ORDER — LACTATED RINGERS IV SOLN: INTRAVENOUS | Status: DC

## 2020-06-16 MED ORDER — ONDANSETRON HCL 4 MG/2ML IJ SOLN
INTRAMUSCULAR | Status: DC | PRN
  Administered 2020-06-16: 4 mg via INTRAVENOUS

## 2020-06-16 MED ORDER — BUPIVACAINE HCL (PF) 0.25 % IJ SOLN
INTRAMUSCULAR | Status: DC | PRN
  Administered 2020-06-16: 20 mL

## 2020-06-16 MED ORDER — WHITE PETROLATUM EX OINT
TOPICAL_OINTMENT | CUTANEOUS | Status: AC
  Filled 2020-06-16: qty 5

## 2020-06-16 MED ORDER — LIDOCAINE 2% (20 MG/ML) 5 ML SYRINGE
INTRAMUSCULAR | Status: DC | PRN
  Administered 2020-06-16: 60 mg via INTRAVENOUS

## 2020-06-16 MED ORDER — SODIUM CHLORIDE 0.9 % IR SOLN
Status: DC | PRN
  Administered 2020-06-16: 800 mL

## 2020-06-16 MED ORDER — OXYCODONE HCL 5 MG/5ML PO SOLN: 5.0000 mg | Freq: Once | ORAL | Status: DC | PRN

## 2020-06-16 MED ORDER — FENTANYL CITRATE (PF) 100 MCG/2ML IJ SOLN
INTRAMUSCULAR | Status: DC | PRN
  Administered 2020-06-16 (×2): 25 ug via INTRAVENOUS
  Administered 2020-06-16: 50 ug via INTRAVENOUS

## 2020-06-16 MED ORDER — FENTANYL CITRATE (PF) 100 MCG/2ML IJ SOLN: 25.0000 ug | INTRAMUSCULAR | Status: DC | PRN

## 2020-06-16 MED ORDER — MIDAZOLAM HCL 2 MG/2ML IJ SOLN
INTRAMUSCULAR | Status: AC
  Filled 2020-06-16: qty 2

## 2020-06-16 MED ORDER — TRAMADOL HCL 50 MG PO TABS: 50.0000 mg | ORAL_TABLET | Freq: Four times a day (QID) | ORAL | 0 refills | Status: DC | PRN

## 2020-06-16 MED ORDER — POVIDONE-IODINE 10 % EX SWAB: 2.0000 "application " | Freq: Once | CUTANEOUS | Status: DC

## 2020-06-16 MED ORDER — PROPOFOL 10 MG/ML IV BOLUS
INTRAVENOUS | Status: AC
  Filled 2020-06-16: qty 20

## 2020-06-16 MED ORDER — VASOPRESSIN 20 UNIT/ML IV SOLN
INTRAVENOUS | Status: DC | PRN
  Administered 2020-06-16: 18 mL via INTRAMUSCULAR

## 2020-06-16 MED ORDER — OXYCODONE HCL 5 MG PO TABS: 5.0000 mg | ORAL_TABLET | Freq: Once | ORAL | Status: DC | PRN

## 2020-06-16 MED ORDER — DEXAMETHASONE SODIUM PHOSPHATE 10 MG/ML IJ SOLN
INTRAMUSCULAR | Status: DC | PRN
  Administered 2020-06-16 (×2): 5 mg via INTRAVENOUS

## 2020-06-16 MED ORDER — DEXAMETHASONE SODIUM PHOSPHATE 10 MG/ML IJ SOLN
INTRAMUSCULAR | Status: AC
  Filled 2020-06-16: qty 1

## 2020-06-16 MED ORDER — FENTANYL CITRATE (PF) 100 MCG/2ML IJ SOLN
INTRAMUSCULAR | Status: AC
  Filled 2020-06-16: qty 2

## 2020-06-16 MED ORDER — KETOROLAC TROMETHAMINE 30 MG/ML IJ SOLN
INTRAMUSCULAR | Status: DC | PRN
  Administered 2020-06-16: 30 mg via INTRAVENOUS

## 2020-06-16 MED ORDER — PROPOFOL 10 MG/ML IV BOLUS
INTRAVENOUS | Status: DC | PRN
  Administered 2020-06-16: 200 mg via INTRAVENOUS

## 2020-06-16 MED ORDER — MIDAZOLAM HCL 2 MG/2ML IJ SOLN
INTRAMUSCULAR | Status: DC | PRN
  Administered 2020-06-16: 2 mg via INTRAVENOUS

## 2020-06-16 SURGICAL SUPPLY — 24 items
ABLATOR SURESOUND NOVASURE (ABLATOR) ×1 IMPLANT
CATH ROBINSON RED A/P 16FR (CATHETERS) ×4 IMPLANT
DECANTER SPIKE VIAL GLASS SM (MISCELLANEOUS) ×8 IMPLANT
DEVICE MYOSURE LITE (MISCELLANEOUS) IMPLANT
DEVICE MYOSURE REACH (MISCELLANEOUS) IMPLANT
GLOVE BIO SURGEON STRL SZ7.5 (GLOVE) ×4 IMPLANT
GLOVE BIOGEL PI IND STRL 7.0 (GLOVE) ×4 IMPLANT
GLOVE BIOGEL PI INDICATOR 7.0 (GLOVE) ×6
GLOVE SURG SS PI 7.0 STRL IVOR (GLOVE) ×3 IMPLANT
GOWN STRL REUS W/ TWL LRG LVL3 (GOWN DISPOSABLE) IMPLANT
GOWN STRL REUS W/TWL LRG LVL3 (GOWN DISPOSABLE) ×5 IMPLANT
HIBICLENS CHG 4% 4OZ (MISCELLANEOUS) ×4 IMPLANT
IV NS IRRIG 3000ML ARTHROMATIC (IV SOLUTION) ×4 IMPLANT
KIT PROCEDURE FLUENT (KITS) ×4 IMPLANT
MYOSURE XL FIBROID (MISCELLANEOUS) ×4
NDL SPNL 22GX3.5 QUINCKE BK (NEEDLE) ×1 IMPLANT
NEEDLE SPNL 22GX3.5 QUINCKE BK (NEEDLE) ×4 IMPLANT
PACK VAGINAL MINOR WOMEN LF (CUSTOM PROCEDURE TRAY) ×4 IMPLANT
PAD OB MATERNITY 4.3X12.25 (PERSONAL CARE ITEMS) ×4 IMPLANT
PAD PREP 24X48 CUFFED NSTRL (MISCELLANEOUS) ×4 IMPLANT
SEAL CERVICAL OMNI LOK (ABLATOR) IMPLANT
SEAL ROD LENS SCOPE MYOSURE (ABLATOR) ×4 IMPLANT
SYSTEM TISS REMOVAL MYOSURE XL (MISCELLANEOUS) ×1 IMPLANT
TOWEL OR 17X26 10 PK STRL BLUE (TOWEL DISPOSABLE) ×4 IMPLANT

## 2020-06-16 NOTE — Anesthesia Procedure Notes (Signed)
Procedure Name: LMA Insertion Date/Time: 06/16/2020 9:34 AM Performed by: Suan Halter, CRNA Pre-anesthesia Checklist: Patient identified, Emergency Drugs available, Suction available and Patient being monitored Patient Re-evaluated:Patient Re-evaluated prior to induction Oxygen Delivery Method: Circle system utilized Preoxygenation: Pre-oxygenation with 100% oxygen Induction Type: IV induction Ventilation: Mask ventilation without difficulty LMA: LMA inserted LMA Size: 4.0 Number of attempts: 1 Airway Equipment and Method: Bite block Placement Confirmation: positive ETCO2 Tube secured with: Tape Dental Injury: Teeth and Oropharynx as per pre-operative assessment

## 2020-06-16 NOTE — Discharge Instructions (Signed)

## 2020-06-16 NOTE — Op Note (Signed)
NAMETINESHA, Lisa Wolf MEDICAL RECORD [id number] ACCOUNT [id number] DATE OF BIRTH:1981-01-12 FACILITY: [facility] LOCATION: [facility] PHYSICIAN:Ashlee Player J. Jameya Pontiff, MD  OPERATIVE REPORT  DATE OF PROCEDURE:  06/16/2020  PREOPERATIVE DIAGNOSIS:  Menometrorrhagia with questionable structural lesion on saline sonohysterogram.  POSTOPERATIVE DIAGNOSIS:  Menometrorrhagia with questionable structural lesion on saline sonohysterogram.  PROCEDURE:  Diagnostic hysteroscopy, dilatation and curettage, MyoSure resection of large posterior wall endometrial polyp, failed NovaSure endometrial ablation.  SURGEON:  Brien Few, MD  ASSISTANT:  None  ANESTHESIA:  Local, general.  ESTIMATED BLOOD LOSS:  Less than 50 mL.  FLUID DEFICIT:  20 mL.  COMPLICATIONS:  None.  DRAINS:  None.  DISPOSITION:  The patient was taken to recovery in good condition.  BRIEF OPERATIVE NOTE:  After being apprised of the risks of anesthesia, infection, bleeding, injury to surrounding organs, possible need for repair, delayed versus immediate complications including bowel and bladder, internal vessel injury, possible need  for repair, patient was brought to the operating room, was administered general anesthetic without complications.  Prepped and draped in usual sterile fashion.  Catheterized until the bladder was empty.  Exam under anesthesia reveals an anteflexed  uterus, no adnexal masses.  Dilute Pitressin solution placed at 3 and 9 o'clock, 18 mL total.  Dilute Marcaine solution placed.  Standard paracervical block, 20 mL total.  Cervix easily dilated up to 21 Pratt dilator.  Hysteroscope placed.  Visualization  reveals a large irregular shaped posterior wall mass consistent with previously known submucous fibroid.  The MyoSure Reach device is entered through the operative channel and resection of this fibroid down to the base was performed without difficulty.   Minimal bleeding was noted.  Fluid deficit of 20.  At  this time, attention was turned to the NovaSure device after the cavity sounds to 9 cm.  The NovaSure device was placed, but was unable to establish the appropriate width to proceed with the  ablation.  In conjunction with this, a separate NovaSure device was tried and malfunctions in a similar fashion.  At this time, patient tolerated the procedure well.  Procedure was terminated.  The patient was transferred to recovery in good condition.  CN/NUANCE  D:06/16/2020 T:06/16/2020 JOB:[id number]

## 2020-06-16 NOTE — Anesthesia Preprocedure Evaluation (Signed)
Anesthesia Evaluation  Patient identified by MRN, date of birth, ID band Patient awake    Reviewed: Allergy & Precautions, NPO status , Patient's Chart, lab work & pertinent test results  Airway Mallampati: II  TM Distance: >3 FB Neck ROM: Full    Dental no notable dental hx.    Pulmonary Current Smoker,    Pulmonary exam normal breath sounds clear to auscultation       Cardiovascular negative cardio ROS Normal cardiovascular exam Rhythm:Regular Rate:Normal     Neuro/Psych negative neurological ROS  negative psych ROS   GI/Hepatic Neg liver ROS, GERD  Medicated,  Endo/Other  negative endocrine ROS  Renal/GU negative Renal ROS  negative genitourinary   Musculoskeletal negative musculoskeletal ROS (+)   Abdominal   Peds negative pediatric ROS (+)  Hematology negative hematology ROS (+)   Anesthesia Other Findings   Reproductive/Obstetrics negative OB ROS                             Anesthesia Physical Anesthesia Plan  ASA: II  Anesthesia Plan: General   Post-op Pain Management:    Induction: Intravenous  PONV Risk Score and Plan: 2 and Ondansetron, Dexamethasone, Treatment may vary due to age or medical condition and Midazolam  Airway Management Planned: LMA  Additional Equipment:   Intra-op Plan:   Post-operative Plan: Extubation in OR  Informed Consent: I have reviewed the patients History and Physical, chart, labs and discussed the procedure including the risks, benefits and alternatives for the proposed anesthesia with the patient or authorized representative who has indicated his/her understanding and acceptance.     Dental advisory given  Plan Discussed with: CRNA and Surgeon  Anesthesia Plan Comments:         Anesthesia Quick Evaluation

## 2020-06-16 NOTE — Transfer of Care (Signed)
Immediate Anesthesia Transfer of Care Note  Patient: FLETCHER RATHBUN  Procedure(s) Performed: Procedure(s) (LRB): DILATATION & CURETTAGE/HYSTEROSCOPY WITH MYOSURE (N/A)  Patient Location: PACU  Anesthesia Type: General  Level of Consciousness: awake, oriented, sedated and patient cooperative  Airway & Oxygen Therapy: Patient Spontanous Breathing and Patient connected to face mask oxygen  Post-op Assessment: Report given to PACU RN and Post -op Vital signs reviewed and stable  Post vital signs: Reviewed and stable  Complications: No apparent anesthesia complications Last Vitals:  Vitals Value Taken Time  BP 131/81 06/16/20 1017  Temp 36.5 C 06/16/20 1014  Pulse 62 06/16/20 1022  Resp 18 06/16/20 1022  SpO2 95 % 06/16/20 1022  Vitals shown include unvalidated device data.  Last Pain:  Vitals:   06/16/20 1014  TempSrc:   PainSc: 0-No pain         Complications: No complications documented.

## 2020-06-16 NOTE — Op Note (Signed)
06/16/2020  10:06 AM  PATIENT:  Lisa Wolf  39 y.o. female  PRE-OPERATIVE DIAGNOSIS:  Submucosal Fibroid, Abnormal Uterine Bleeding  POST-OPERATIVE DIAGNOSIS:  Submucosal Fibroid, Abnormal Uterine Bleeding  PROCEDURE:  Procedure(s): DILATATION & CURETTAGE HYSTEROSCOPY WITH MYOSURE Failed novasure ablation  SURGEON:  Surgeon(s): Brien Few, MD  ASSISTANTS: none   ANESTHESIA:   local and general  ESTIMATED BLOOD LOSS: minimal  DRAINS: none   LOCAL MEDICATIONS USED:  MARCAINE    and Amount: 20 ml  SPECIMEN:  Source of Specimen:  emc and SM fibroid fragments  DISPOSITION OF SPECIMEN:  PATHOLOGY  COUNTS:  YES  DICTATION #: *[id number]  PLAN OF CARE: dc home  PATIENT DISPOSITION:  PACU - hemodynamically stable.

## 2020-06-16 NOTE — Progress Notes (Signed)
Patient seen and examined. Consent witnessed and signed. No changes noted. Update completed. BP 114/74   Pulse 83   Temp 98.4 F (36.9 C) (Oral)   Resp 18   Ht 5\' 6"  (1.676 m)   Wt 90.3 kg   LMP 05/20/2020   SpO2 100%   BMI 32.12 kg/m   CBC    Component Value Date/Time   WBC 6.8 06/16/2020 0743   RBC 4.29 06/16/2020 0743   HGB 13.9 06/16/2020 0743   HCT 40.5 06/16/2020 0743   PLT 343 06/16/2020 0743   MCV 94.4 06/16/2020 0743   MCH 32.4 06/16/2020 0743   MCHC 34.3 06/16/2020 0743   RDW 12.7 06/16/2020 0743   LYMPHSABS 3.7 09/08/2015 2035   MONOABS 0.9 09/08/2015 2035   EOSABS 0.2 09/08/2015 2035   BASOSABS 0.0 09/08/2015 2035

## 2020-06-16 NOTE — Anesthesia Postprocedure Evaluation (Signed)
Anesthesia Post Note  Patient: Lisa Wolf  Procedure(s) Performed: DILATATION & CURETTAGE/HYSTEROSCOPY WITH MYOSURE (N/A Vagina )     Patient location during evaluation: PACU Anesthesia Type: General Level of consciousness: awake and alert Pain management: pain level controlled Vital Signs Assessment: post-procedure vital signs reviewed and stable Respiratory status: spontaneous breathing, nonlabored ventilation, respiratory function stable and patient connected to nasal cannula oxygen Cardiovascular status: blood pressure returned to baseline and stable Postop Assessment: no apparent nausea or vomiting Anesthetic complications: no   No complications documented.  Last Vitals:  Vitals:   06/16/20 1015 06/16/20 1030  BP:  131/70  Pulse: 69 65  Resp: 16 (!) 21  Temp:    SpO2: 95% 96%    Last Pain:  Vitals:   06/16/20 1045  TempSrc:   PainSc: 0-No pain                 Devin Foskey S

## 2020-06-16 NOTE — H&P (Signed)
Lisa Wolf is an 39 y.o. female. AUB for diag hs and EAB  Pertinent Gynecological History: Menses: flow is moderate Bleeding: intermenstrual bleeding Contraception: none DES exposure: denies Blood transfusions: none Sexually transmitted diseases: no past history Previous GYN Procedures: DNC  Last mammogram: na Date: na Last pap: normal Date: 2021 OB History: G1, P1   Menstrual History: Menarche age: 26 Patient's last menstrual period was 05/20/2020.    Past Medical History:  Diagnosis Date  . Abnormal Pap smear   . COVID 09/20/2019   loss of taste and smell headache x 2 3 days all symptoms resolved  . GERD (gastroesophageal reflux disease)   . History of pregnancy induced hypertension 2013  . Migraine   . Neuromuscular disorder ([place])    2 pinched discs lower back    Past Surgical History:  Procedure Laterality Date  . CESAREAN SECTION  06/24/2012   Procedure: CESAREAN SECTION;  Surgeon: Lovenia Kim, MD;  Location: [facility];  Service: Obstetrics;  Laterality: N/A;  . CHOLECYSTECTOMY  05/2019   laparoscopic  . LAPAROSCOPIC APPENDECTOMY N/A 09/08/2015   Procedure: APPENDECTOMY LAPAROSCOPIC;  Surgeon: Aviva Signs, MD;  Location: [facility];  Service: General;  Laterality: N/A;  . WISDOM TOOTH EXTRACTION      Family History  Problem Relation Age of Onset  . Asthma Mother   . Healthy Father     Social History:  reports that she has been smoking cigarettes. She has a 10.00 pack-year smoking history. She has never used smokeless tobacco. She reports that she does not drink alcohol and does not use drugs.  Allergies:  Allergies  Allergen Reactions  . Ciprofloxacin     nerulogical symptoms    No medications prior to admission.    Review of Systems  Constitutional: Negative.   All other systems reviewed and are negative.   Height 5\' 6"  (1.676 m), weight 89.8 kg, last menstrual period 05/20/2020, unknown if currently breastfeeding. Physical Exam Vitals and  nursing note reviewed.  Constitutional:      Appearance: Normal appearance.  HENT:     Head: Normocephalic and atraumatic.  Cardiovascular:     Rate and Rhythm: Normal rate and regular rhythm.     Pulses: Normal pulses.     Heart sounds: Normal heart sounds.  Pulmonary:     Effort: Pulmonary effort is normal.     Breath sounds: Normal breath sounds.  Abdominal:     General: Bowel sounds are normal.     Palpations: Abdomen is soft.  Genitourinary:    General: Normal vulva.  Musculoskeletal:        General: Normal range of motion.     Cervical back: Normal range of motion and neck supple.  Skin:    General: Skin is warm and dry.  Neurological:     General: No focal deficit present.     Mental Status: She is alert and oriented to person, place, and time.     No results found for this or any previous visit (from the past 24 hour(s)).  No results found.  Assessment/Plan: AUB with SM fibroid Diag HS, D&C with myosure and EAB Surgical consent, risks vs benefits discussed  Mari Battaglia J 06/16/2020, 6:57 AM

## 2020-06-17 ENCOUNTER — Encounter (HOSPITAL_BASED_OUTPATIENT_CLINIC_OR_DEPARTMENT_OTHER): Payer: Self-pay | Admitting: Obstetrics and Gynecology

## 2020-06-17 LAB — SURGICAL PATHOLOGY

## 2020-12-29 ENCOUNTER — Ambulatory Visit: Payer: Commercial Managed Care - PPO

## 2020-12-29 ENCOUNTER — Encounter: Payer: Self-pay | Admitting: Orthopedic Surgery

## 2020-12-29 ENCOUNTER — Ambulatory Visit (INDEPENDENT_AMBULATORY_CARE_PROVIDER_SITE_OTHER): Payer: Commercial Managed Care - PPO | Admitting: Orthopedic Surgery

## 2020-12-29 ENCOUNTER — Other Ambulatory Visit: Payer: Self-pay

## 2020-12-29 VITALS — BP 126/84 | HR 75 | Ht 66.0 in | Wt 196.0 lb

## 2020-12-29 DIAGNOSIS — M5410 Radiculopathy, site unspecified: Secondary | ICD-10-CM

## 2020-12-29 DIAGNOSIS — M4306 Spondylolysis, lumbar region: Secondary | ICD-10-CM

## 2020-12-29 HISTORY — DX: Radiculopathy, site unspecified: M54.10

## 2020-12-29 MED ORDER — GABAPENTIN 100 MG PO CAPS: 100.0000 mg | ORAL_CAPSULE | Freq: Three times a day (TID) | ORAL | 2 refills | Status: AC

## 2020-12-29 NOTE — Progress Notes (Signed)
NEW PROBLEM//OFFICE VISIT  Summary assessment and plan:  Encounter Diagnoses  Name Primary?  . Radicular pain of right lower extremity Yes  . Spondylolysis, lumbar region     Meds ordered this encounter  Medications  . gabapentin (NEURONTIN) 100 MG capsule    Sig: Take 1 capsule (100 mg total) by mouth 3 (three) times daily.    Dispense:  90 capsule    Refill:  2    Order MRI   Chief Complaint  Patient presents with  . Back Problem    "sciatica" right     40 year old female who has lumbar spine pain was treated many years ago including epidural injections with good result for several years.  However diffuse after that she started having intermittent flareups which were treated with oral steroids with good result.  Last fall she had a really bad spell with her back and she was treated for that with a chiropractic visit 2/2 and her back pain improved significantly however her leg pain did not  On the last 2 months her right leg pain has worsened she was taking ibuprofen Aleve and Tylenol and did not improve  We should note that her recreational activities include horseback riding which will aggravate her back but she can usually rest and get it to calm down   Review of Systems  Constitutional: Negative for chills, fever, malaise/fatigue and weight loss.  Gastrointestinal: Negative for constipation.       Denies loss bowel control   Genitourinary:       Denies urinary retention or los of bladder control      Past Medical History:  Diagnosis Date  . Abnormal Pap smear   . COVID 09/20/2019   loss of taste and smell headache x 2 3 days all symptoms resolved  . GERD (gastroesophageal reflux disease)   . History of pregnancy induced hypertension 2013  . Migraine   . Neuromuscular disorder ([place])    2 pinched discs lower back    Past Surgical History:  Procedure Laterality Date  . CESAREAN SECTION  06/24/2012   Procedure: CESAREAN SECTION;  Surgeon: Lovenia Kim,  MD;  Location: [facility];  Service: Obstetrics;  Laterality: N/A;  . CHOLECYSTECTOMY  05/2019   laparoscopic  . DILATATION & CURETTAGE/HYSTEROSCOPY WITH MYOSURE N/A 06/16/2020   Procedure: DILATATION & CURETTAGE/HYSTEROSCOPY WITH MYOSURE;  Surgeon: Brien Few, MD;  Location: [facility];  Service: Gynecology;  Laterality: N/A;  . LAPAROSCOPIC APPENDECTOMY N/A 09/08/2015   Procedure: APPENDECTOMY LAPAROSCOPIC;  Surgeon: Aviva Signs, MD;  Location: [facility];  Service: General;  Laterality: N/A;  . WISDOM TOOTH EXTRACTION      Family History  Problem Relation Age of Onset  . Asthma Mother   . Healthy Father    Social History   Tobacco Use  . Smoking status: Current Every Day Smoker    Packs/day: 0.50    Years: 20.00    Pack years: 10.00    Types: Cigarettes  . Smokeless tobacco: Never Used  Vaping Use  . Vaping Use: Never used  Substance Use Topics  . Alcohol use: No  . Drug use: No    Allergies  Allergen Reactions  . Ciprofloxacin     nerulogical symptoms    Current Meds  Medication Sig  . EUTHYROX 137 MCG tablet Take 137 mcg by mouth daily.  . famotidine (PEPCID) 20 MG tablet Take 20 mg by mouth as needed for heartburn or indigestion.  . gabapentin (NEURONTIN) 100 MG  capsule Take 1 capsule (100 mg total) by mouth 3 (three) times daily.  . [DISCONTINUED] EUTHYROX 125 MCG tablet   . [DISCONTINUED] norethindrone (MICRONOR) 0.35 MG tablet Take 1 tablet by mouth daily.  . [DISCONTINUED] traMADol (ULTRAM) 50 MG tablet Take 1-2 tablets (50-100 mg total) by mouth every 6 (six) hours as needed.    BP 126/84   Pulse 75   Ht 5\' 6"  (1.676 m)   Wt 196 lb (88.9 kg)   LMP 12/26/2020   BMI 31.64 kg/m   Physical Exam  General appearance: Well-developed well-nourished no gross deformities  Cardiovascular normal pulse and perfusion normal color without edema  Neurologically o sensation loss or deficits or pathologic reflexes  Psychological: Awake alert and  oriented x3 mood and affect normal  Skin no lacerations or ulcerations no nodularity no palpable masses, no erythema or nodularity  Musculoskeletal:  Spinal alignment seems to be normal in the coronal plane hip heights are normal.  She has lower back tenderness which is mild.  She can touch her toes she has pain with extension  She has tightness with straight leg raising on the right none on the left cross leg sign is negative  Sensation is intact bilaterally  Reflexes are equal  Hip range of motion is normal bilaterally  Normal strength in each leg       MEDICAL DECISION MAKING  A.  Encounter Diagnoses  Name Primary?  . Radicular pain of right lower extremity Yes  . Spondylolysis, lumbar region     B. DATA ANALYSED:   IMAGING: Interpretation of images: internal  Lumbar   spondylolysyis L4-5   Orders: MRI  Outside records reviewed: NO   C. MANAGEMENT   Non surgical   Meds ordered this encounter  Medications  . gabapentin (NEURONTIN) 100 MG capsule    Sig: Take 1 capsule (100 mg total) by mouth 3 (three) times daily.    Dispense:  90 capsule    Refill:  2      Arther Abbott, MD  12/29/2020 11:55 AM

## 2020-12-29 NOTE — Patient Instructions (Addendum)
While we are working on your approval for MRI please go ahead and call to schedule your appointment with [facility] within at least one (1) week.   Central Scheduling [phone]  Spondylolysis  Spondylolysis is a small break or crack (stress fracture) in a bone in the spine (vertebra) in the lower back (lumbar spine). The stress fracture occurs on the bony mass between and behind the vertebra. Spondylolysis may be caused by an injury (trauma) or by overuse. Since the lower back is almost always under pressure from daily living, this stress fracture usually does not heal normally. Spondylolysis may eventually cause one vertebra to slip forward and out of place (spondylolisthesis). What are the causes? This condition may be caused by:  Trauma, such as a fall.  Excessive wear and tear. This is often a result of doing sports or physical activities that involve repetitive overstretching (hyperextension) and rotation of the spine. What increases the risk?  You are more likely to develop this condition if you have: ? A family history of this condition. ? An inward curvature of your spine (lordosis). ? A condition that affects your spine, such as spina bifida. You are more likely to develop this condition if you participate in:  Gymnastics.  Dance.  Football.  Wrestling.  Martial arts.  Weight lifting.  Tennis.  Swimming. What are the signs or symptoms? Symptoms of this condition may include:  Long-lasting (chronic) pain in the lower back.  Stiffness in the back or legs.  Tightness in the hamstring muscles, which are in the backs of the thighs. In some cases, there may be no symptoms of this condition. How is this diagnosed? This condition may be diagnosed based on:  Your symptoms.  Your medical history.  A physical exam.  Imaging tests, such as: ? X-rays. ? CT scan. ? MRI. How is this treated? This condition may be treated by:  Resting. You may be  asked to avoid or modify activities that put strain on your back until your symptoms improve.  Medicines to help relieve pain.  NSAIDs to help reduce swelling and discomfort.  Injections of medicine (cortisone) in your back. These injections can help to relieve pain and numbness.  A brace to stabilize and support your back.  Physical therapy. You may work with an occupational therapist or physical therapist who can teach you how to reduce pressure on your back while you do everyday activities.  Surgery. This may be needed if you have: ? A severe injury. ? Pain that lasts for more than 6 months. ? Numbness in your pelvic region. ? Changes in control of your stool or urine. Follow these instructions at home: Medicines  Take over-the-counter and prescription medicines only as told by your health care provider.  Ask your health care provider if the medicine prescribed to you: ? Requires you to avoid driving or using heavy machinery. ? Can cause constipation. You may need to take these actions to prevent or treat constipation:  Drink enough fluid to keep your urine pale yellow.  Take over-the-counter or prescription medicines.  Eat foods that are high in fiber, such as beans, whole grains, and fresh fruits and vegetables.  Limit foods that are high in fat and processed sugars, such as fried or sweet foods. If you have a brace:  Wear the brace as told by your health care provider. Remove it only as told by your health care provider.  Keep the brace clean.  If the brace is not  waterproof: ? Do not let it get wet. ? Cover it with a watertight covering when you take a bath or a shower. Activity  Rest and return to your normal activities as told by your health care provider. Ask your health care provider what activities are safe for you.  Ask your health care provider when it is safe to drive if you have a back brace.  Work with a physical therapist to make a safe exercise  program, as recommended by your health care provider. Do exercises as told by your physical therapist. This may include exercises to strengthen your back and abdominal muscles (core exercises). Managing pain, stiffness, and swelling  If directed, put ice on the affected area. ? If you have a removable brace, remove it as told by your health care provider. ? Put ice in a plastic bag. ? Place a towel between your skin and the bag. ? Leave the ice on for 20 minutes, 2-3 times a day.  If directed, apply heat to the affected area as often as told by your health care provider. Use the heat source that your health care provider recommends, such as a moist heat pack or a heating pad. ? If you have a removable brace, remove it as told by your health care provider. ? Place a towel between your skin and the heat source. ? Leave the heat on for 20-30 minutes. ? Remove the heat if your skin turns bright red. This is especially important if you are unable to feel pain, heat, or cold. You may have a greater risk of getting burned.      General instructions  Do not use any products that contain nicotine or tobacco, such as cigarettes, e-cigarettes, and chewing tobacco. These can delay bone healing. If you need help quitting, ask your health care provider.  Maintain a healthy weight. Extra weight puts stress on your back.  Keep all follow-up visits as told by your health care provider. This is important. Contact a health care provider if:  You have pain that gets worse or does not get better. Get help right away if:  You have severe back pain.  You have changes in control of your stool or urine.  You develop weakness or numbness in your legs.  You are unable to stand or walk. Summary  Spondylolysis is a small break or crack (stress fracture) in a bone in the spine (vertebra) in the lower back (lumbar spine).  This condition may be treated by resting, medicines, physical therapy, wearing a  brace, or surgery.  Rest and return to your normal activities as told by your health care provider. Ask your health care provider what activities are safe for you.  Contact a health care provider if you have pain that gets worse or does not get better. This information is not intended to replace advice given to you by your health care provider. Make sure you discuss any questions you have with your health care provider. Document Revised: 12/04/2018 Document Reviewed: 03/18/2018 Elsevier Patient Education  2021 [employer].

## 2021-01-12 ENCOUNTER — Ambulatory Visit (HOSPITAL_COMMUNITY)
Admission: RE | Admit: 2021-01-12 | Discharge: 2021-01-12 | Disposition: A | Discharge status: Home / Self Care | Payer: Commercial Managed Care - PPO | Source: Ambulatory Visit | Attending: Orthopedic Surgery | Admitting: Orthopedic Surgery

## 2021-01-12 DIAGNOSIS — M5410 Radiculopathy, site unspecified: Secondary | ICD-10-CM | POA: Insufficient documentation

## 2021-01-19 ENCOUNTER — Encounter: Payer: Self-pay | Admitting: Orthopedic Surgery

## 2021-01-19 ENCOUNTER — Ambulatory Visit (INDEPENDENT_AMBULATORY_CARE_PROVIDER_SITE_OTHER): Payer: Commercial Managed Care - PPO | Admitting: Orthopedic Surgery

## 2021-01-19 ENCOUNTER — Other Ambulatory Visit: Payer: Self-pay

## 2021-01-19 VITALS — BP 135/77 | HR 88 | Ht 66.0 in | Wt 196.0 lb

## 2021-01-19 DIAGNOSIS — M5137 Other intervertebral disc degeneration, lumbosacral region: Secondary | ICD-10-CM | POA: Diagnosis not present

## 2021-01-19 DIAGNOSIS — M5410 Radiculopathy, site unspecified: Secondary | ICD-10-CM | POA: Diagnosis not present

## 2021-01-19 DIAGNOSIS — M4306 Spondylolysis, lumbar region: Secondary | ICD-10-CM | POA: Diagnosis not present

## 2021-01-19 NOTE — Progress Notes (Signed)
Chief Complaint  Patient presents with  . Back Pain    MRI results of l-spine   Encounter Diagnoses  Name Primary?  . Radicular pain of right lower extremity Yes  . Spondylolysis, lumbar region   . DDD (degenerative disc disease), lumbosacral     40 year old female with right lower extremity radicular symptoms sent for MRI-on gabapentin  Although she says she is somewhat better and she is able to get comfortable she still having radicular pain  Her MRI report is below which I showed to her and reviewed  My opinion is that the patient has severe L4-S1 degenerative disc disease with spinal stenosis and disc protrusion with facet arthritis    Recommend that she see neurosurgery to become an established patient as well as make further recommendations regarding her disc pathology  L4-L5: Disc desiccation. Mild circumferential disc bulge with a broad-based posterior disc protrusion and annular fissure in the midline (series 5, image 8 and series 8, image 23). Moderate facet and ligament flavum hypertrophy. Mild bilateral lateral recess stenosis (L5 nerve levels) without convincing spinal or foraminal stenosis.  L5-S1: Disc desiccation. Moderate size right paracentral disc extrusion with annular fissure directed toward the right lateral recess (series 8, image 27). Only mild facet hypertrophy. Severe stenosis at the right lateral recess (descending right S1 nerve level). No spinal or foraminal stenosis.  IMPRESSION: 1. Symptomatic level favored to be L5-S1 where a moderate-sized rightward disc extrusion results in severe stenosis at the right lateral recess. Query Right S1 radiculitis. 2. L4-L5 disc and facet degeneration with mild lateral recess stenosis at the L5 nerve levels related to broad-based disc bulge or protrusion with annular fissure. 3. Mild facet degeneration at L3-L4.   Electronically Signed   By: Genevie Ann M.D.   On: 01/13/2021 06:04

## 2021-01-19 NOTE — Patient Instructions (Signed)
Referral to [facility] neurosurgery

## 2021-01-19 NOTE — Addendum Note (Signed)
Addended byCandice Camp on: 01/19/2021 04:17 PM   Modules accepted: Orders

## 2021-03-27 ENCOUNTER — Ambulatory Visit
Admission: RE | Admit: 2021-03-27 | Discharge: 2021-03-27 | Disposition: A | Discharge status: Home / Self Care | Payer: Commercial Managed Care - PPO | Source: Ambulatory Visit | Attending: Family Medicine | Admitting: Family Medicine

## 2021-03-27 ENCOUNTER — Other Ambulatory Visit: Payer: Self-pay

## 2021-03-27 VITALS — BP 121/73 | HR 95 | Temp 99.1°F | Resp 18

## 2021-03-27 DIAGNOSIS — J069 Acute upper respiratory infection, unspecified: Secondary | ICD-10-CM

## 2021-03-27 MED ORDER — PROMETHAZINE-DM 6.25-15 MG/5ML PO SYRP: 5.0000 mL | ORAL_SOLUTION | Freq: Four times a day (QID) | ORAL | 0 refills | Status: DC | PRN

## 2021-03-27 MED ORDER — PSEUDOEPHEDRINE HCL ER 120 MG PO TB12: 120.0000 mg | ORAL_TABLET | Freq: Two times a day (BID) | ORAL | 0 refills | Status: AC

## 2021-03-27 NOTE — ED Triage Notes (Addendum)
Headache, body aches, fever, chills that started last night.  Pt does have a cough but has had it x 2 weeks from a prior cold.  Neg at home covid test

## 2021-03-27 NOTE — ED Provider Notes (Addendum)
[facility]    CSN: [id number] Arrival date & time: 03/27/21  1241      History   Chief Complaint No chief complaint on file.   HPI Lisa Wolf is a 40 y.o. female.   HPI Patient presents today with symptoms of headache, body aches, fever, chills that started last night.   Patient has had a cough which has been present for over 2 weeks prior to the onset of her symptoms yesterday. She has taken a home COVID test which was negative.  Denies any known exposures to anyone positive for COVID. Past Medical History:  Diagnosis Date   Abnormal Pap smear    COVID 09/20/2019   loss of taste and smell headache x 2 3 days all symptoms resolved   GERD (gastroesophageal reflux disease)    History of pregnancy induced hypertension 2013   Migraine    Neuromuscular disorder ([place])    2 pinched discs lower back    Patient Active Problem List   Diagnosis Date Noted   Acute appendicitis 09/08/2015   SCIATICA 08/29/2009    Past Surgical History:  Procedure Laterality Date   CESAREAN SECTION  06/24/2012   Procedure: CESAREAN SECTION;  Surgeon: Lovenia Kim, MD;  Location: [facility];  Service: Obstetrics;  Laterality: N/A;   CHOLECYSTECTOMY  05/2019   laparoscopic   DILATATION & CURETTAGE/HYSTEROSCOPY WITH MYOSURE N/A 06/16/2020   Procedure: DILATATION & CURETTAGE/HYSTEROSCOPY WITH MYOSURE;  Surgeon: Brien Few, MD;  Location: [facility];  Service: Gynecology;  Laterality: N/A;   LAPAROSCOPIC APPENDECTOMY N/A 09/08/2015   Procedure: APPENDECTOMY LAPAROSCOPIC;  Surgeon: Aviva Signs, MD;  Location: [facility];  Service: General;  Laterality: N/A;   WISDOM TOOTH EXTRACTION      OB History     Gravida  1   Para  1   Term  1   Preterm  0   AB  0   Living  1      SAB  0   IAB  0   Ectopic  0   Multiple  0   Live Births  1            Home Medications    Prior to Admission medications   Medication Sig Start Date End Date Taking?  Authorizing Provider  promethazine-dextromethorphan (PROMETHAZINE-DM) 6.25-15 MG/5ML syrup Take 5 mLs by mouth 4 (four) times daily as needed for cough. 03/27/21  Yes Scot Jun, FNP  pseudoephedrine (SUDAFED 12 HOUR) 120 MG 12 hr tablet Take 1 tablet (120 mg total) by mouth 2 (two) times daily for 7 days. 03/27/21 04/03/21 Yes Scot Jun, FNP  EUTHYROX 137 MCG tablet Take 137 mcg by mouth daily. 10/06/20   [provider]  famotidine (PEPCID) 20 MG tablet Take 20 mg by mouth as needed for heartburn or indigestion.    [provider]  gabapentin (NEURONTIN) 100 MG capsule Take 1 capsule (100 mg total) by mouth 3 (three) times daily. 12/29/20   Carole Civil, MD  TRI-SPRINTEC 0.18/0.215/0.25 MG-35 MCG tablet Take 1 tablet by mouth daily. 07/03/15 12/03/19  [provider]    Family History Family History  Problem Relation Age of Onset   Asthma Mother    Healthy Father     Social History Social History   Tobacco Use   Smoking status: Every Day    Packs/day: 0.50    Years: 20.00    Pack years: 10.00    Types: Cigarettes  Smokeless tobacco: Never  Vaping Use   Vaping Use: Never used  Substance Use Topics   Alcohol use: No   Drug use: No     Allergies   Ciprofloxacin   Review of Systems Review of Systems Pertinent negatives listed in HPI   Physical Exam Triage Vital Signs ED Triage Vitals  Enc Vitals Group     BP 03/27/21 1333 121/73     Pulse Rate 03/27/21 1333 95     Resp 03/27/21 1333 18     Temp 03/27/21 1333 99.1 F (37.3 C)     Temp Source 03/27/21 1333 Oral     SpO2 03/27/21 1333 96 %     Weight --      Height --      Head Circumference --      Peak Flow --      Pain Score 03/27/21 1334 5     Pain Loc --      Pain Edu? --      Excl. in [place]? --    No data found.  Updated Vital Signs BP 121/73 (BP Location: Right Arm)   Pulse 95   Temp 99.1 F (37.3 C) (Oral)   Resp 18   SpO2 96%   Visual Acuity Right Eye  Distance:   Left Eye Distance:   Bilateral Distance:    Right Eye Near:   Left Eye Near:    Bilateral Near:     Physical Exam  General Appearance:    Alert, cooperative, no distress  HENT:   Normocephalic, ears normal, nares mucosal edema with congestion, rhinorrhea, oropharynx clear   Eyes:    PERRL, conjunctiva/corneas clear, EOM's intact       Lungs:     Clear to auscultation bilaterally, respirations unlabored  Heart:    Regular rate and rhythm  Neurologic:   Awake, alert, oriented x 3. No apparent focal neurological           defect.      UC Treatments / Results  Labs (all labs ordered are listed, but only abnormal results are displayed) Labs Reviewed - No data to display  EKG   Radiology No results found.  Procedures Procedures (including critical care time)  Medications Ordered in UC Medications - No data to display  Initial Impression / Assessment and Plan / UC Course  I have reviewed the triage vital signs and the nursing notes.  Pertinent labs & imaging results that were available during my care of the patient were reviewed by me and considered in my medical decision making (see chart for details).    Viral URI with cough. Treatment per discharge medication orders. Strict ER precaution if symptoms become severe. Follow-up with PCP or return as needed. Final Clinical Impressions(s) / UC Diagnoses   Final diagnoses:  Viral URI with cough   Discharge Instructions   None    ED Prescriptions     Medication Sig Dispense Auth. Provider   promethazine-dextromethorphan (PROMETHAZINE-DM) 6.25-15 MG/5ML syrup Take 5 mLs by mouth 4 (four) times daily as needed for cough. 140 mL Scot Jun, FNP   pseudoephedrine (SUDAFED 12 HOUR) 120 MG 12 hr tablet Take 1 tablet (120 mg total) by mouth 2 (two) times daily for 7 days. 14 tablet Scot Jun, FNP      PDMP not reviewed this encounter.   Scot Jun, FNP 03/31/21 [place],  Athena, FNP 03/31/21 [phone]

## 2021-04-10 IMAGING — US ULTRASOUND ABDOMEN COMPLETE
1 series · 14 of 25 positions shown · non-contrast
Comparison: CT scan of the abdomen dated 09/08/2015

CLINICAL DATA: Postprandial epigastric pain for 1 year.

EXAM:
ABDOMEN ULTRASOUND COMPLETE

[Series 1: ultrasound abdomen complete · 14 of 92 slices shown]
[im 1/92]
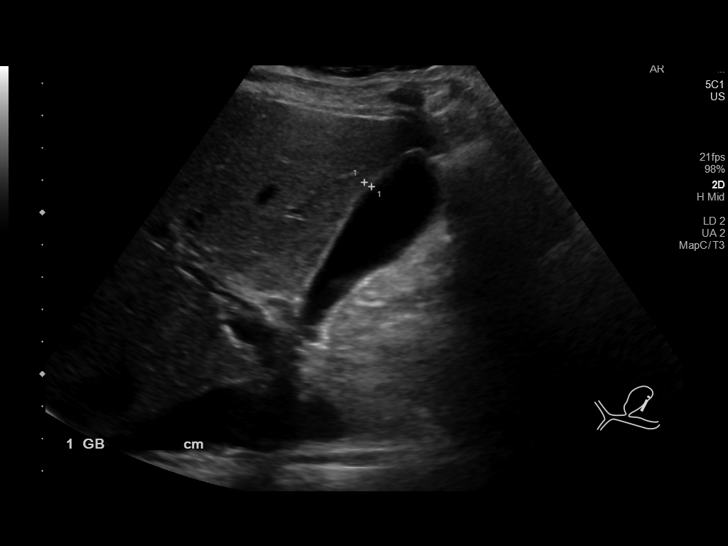
[im 8/92]
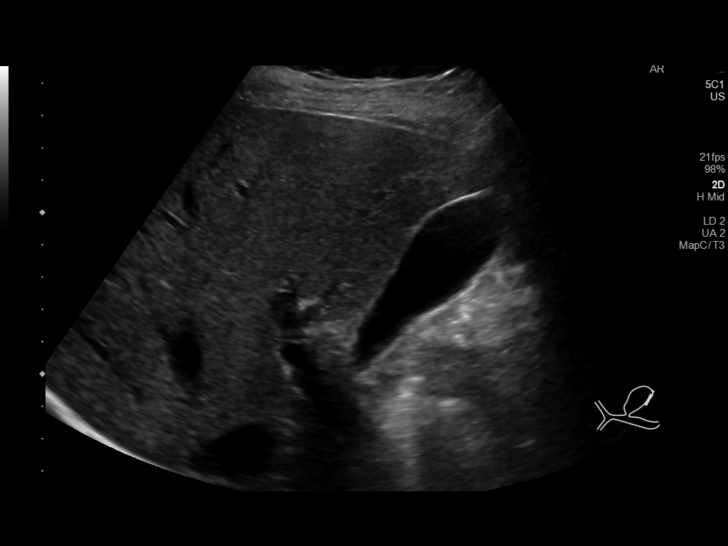
[im 16/92]
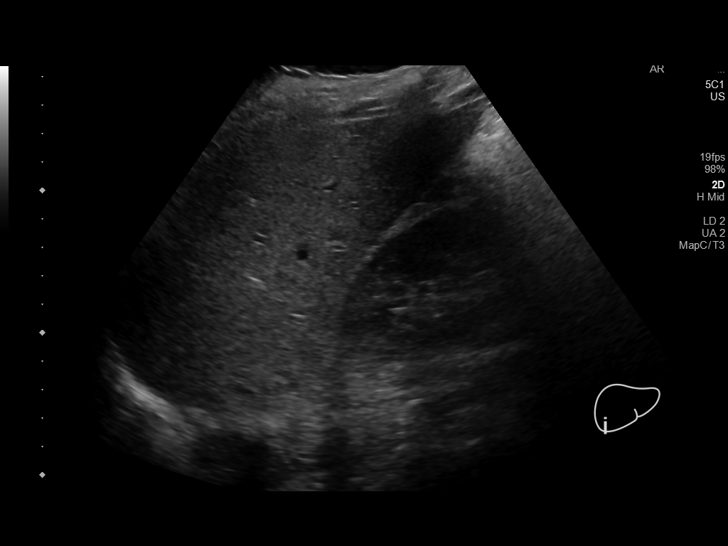
[im 23/92]
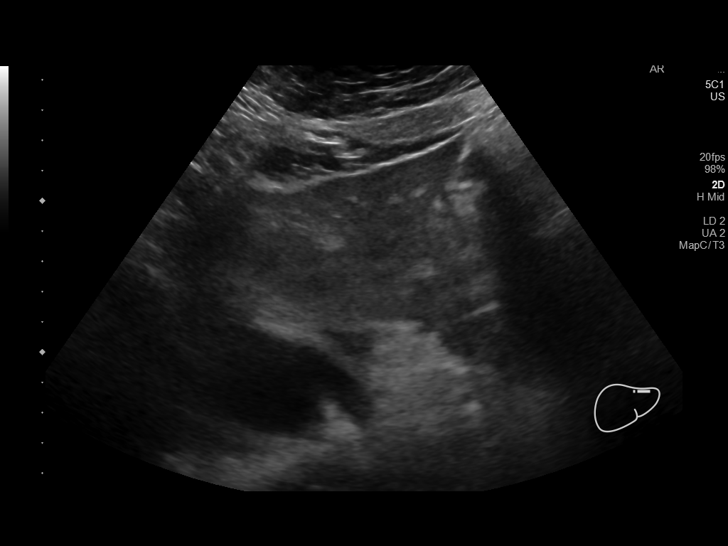
[im 31/92]
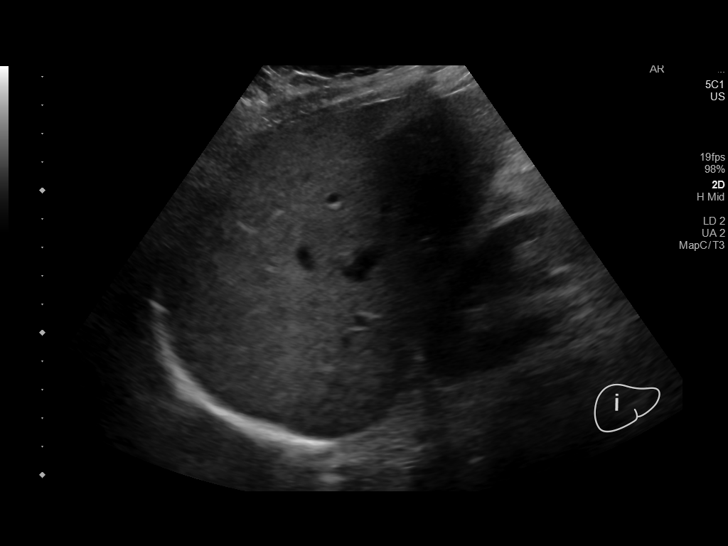
[im 35/92]
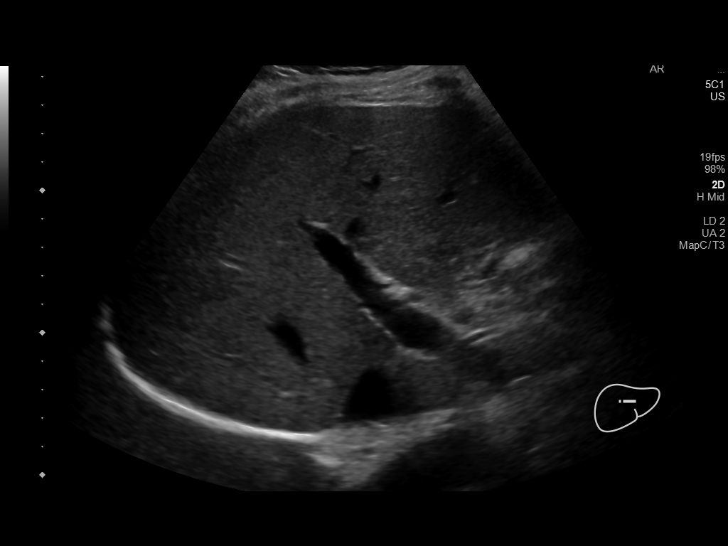
[im 42/92]
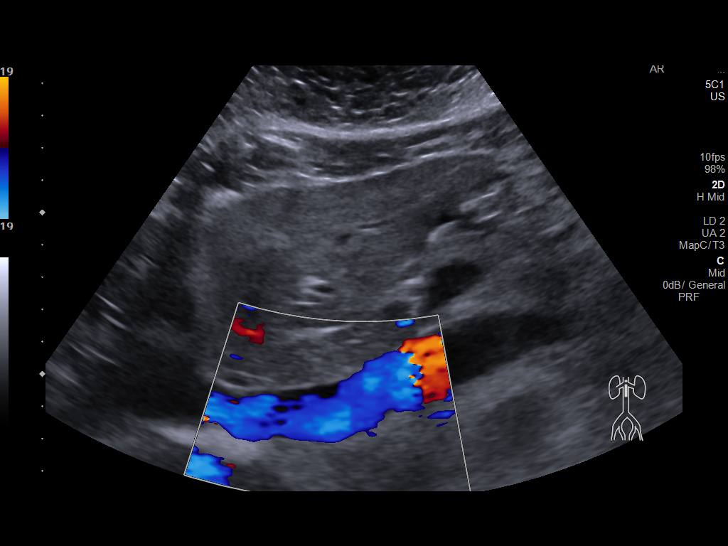
[im 50/92]
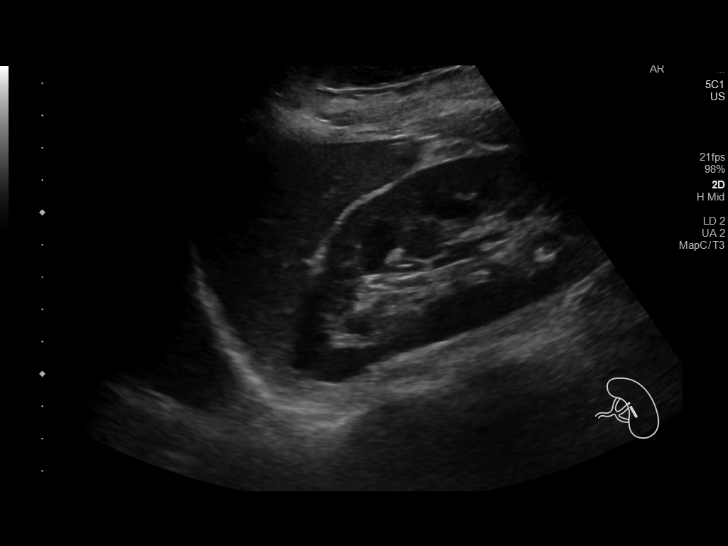
[im 57/92]
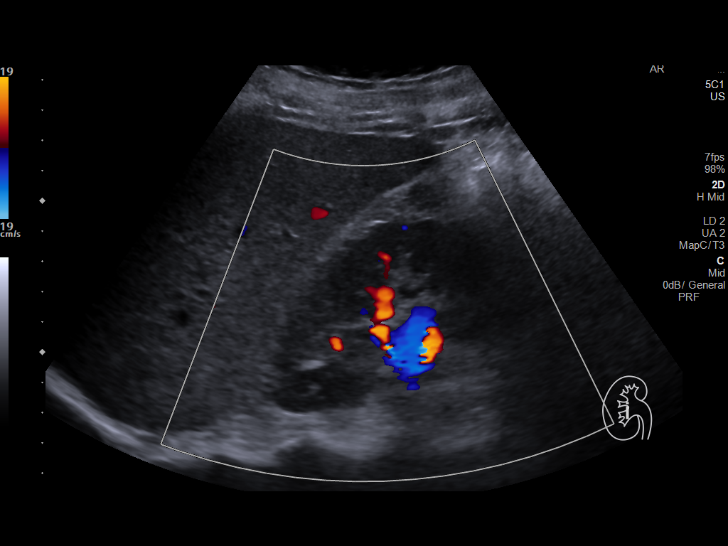
[im 61/92]
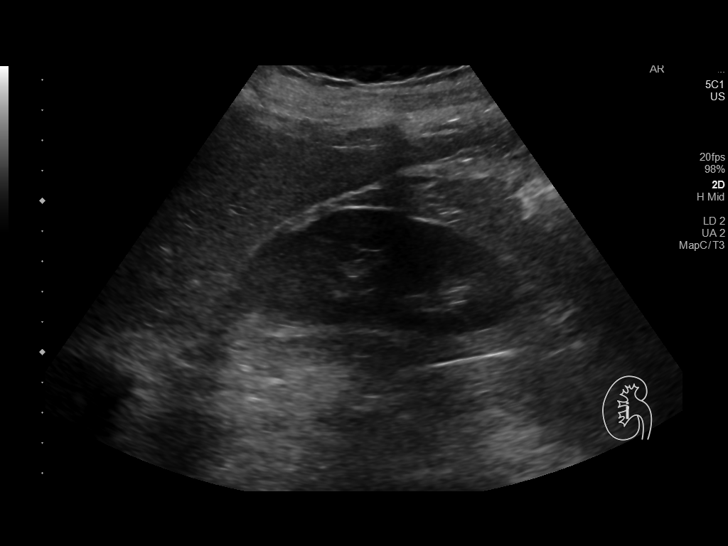
[im 69/92]
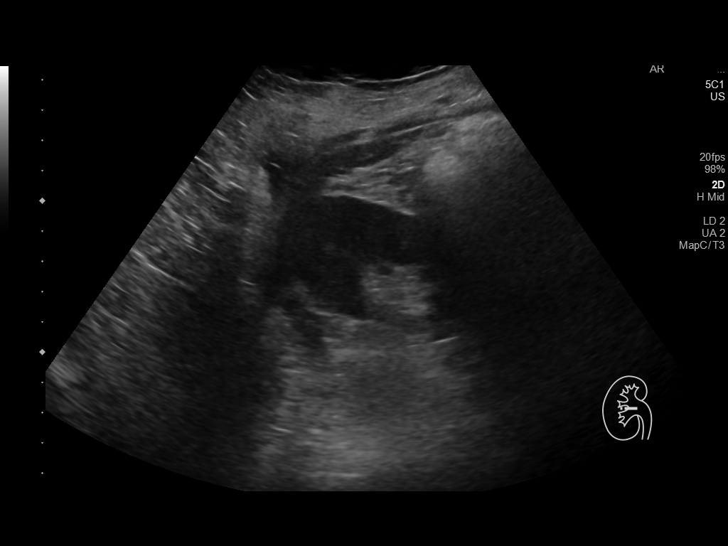
[im 76/92]
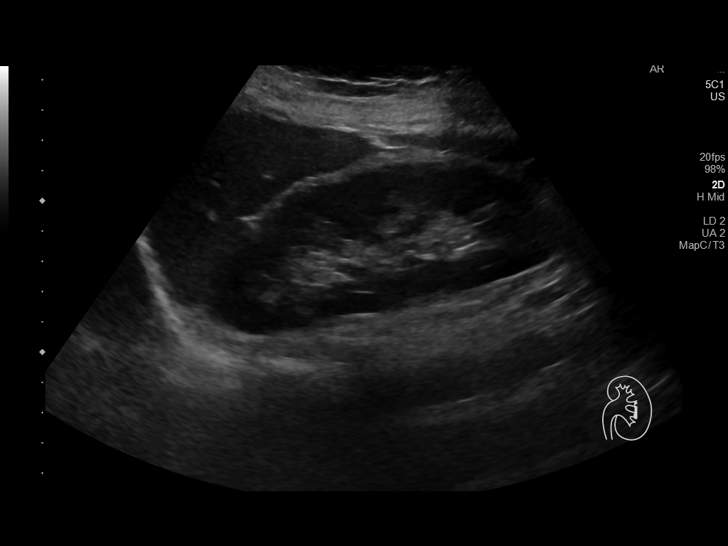
[im 84/92]
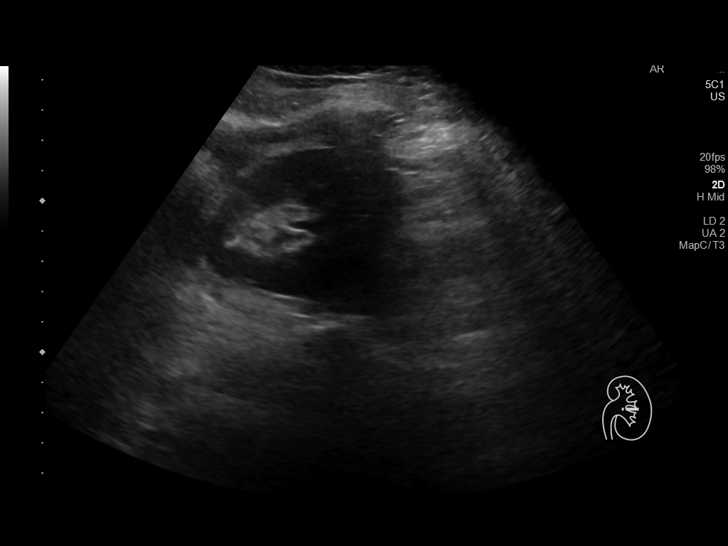
[im 92/92]
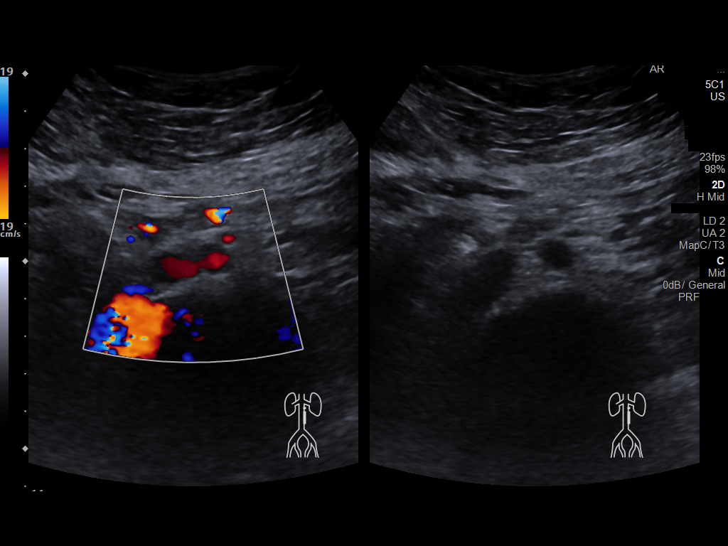

[14 of 25 positions shown; findings below may reference images not displayed]

FINDINGS: Gallbladder: No gallstones or wall thickening visualized. No
sonographic Murphy sign noted by sonographer.

Common bile duct: Diameter: 5 mm, normal

Liver: No focal lesion identified. Within normal limits in
parenchymal echogenicity. Portal vein is patent on color Doppler
imaging with normal direction of blood flow towards the liver.

IVC: No abnormality visualized.

Pancreas: Visualized portion unremarkable.

Spleen: Size and appearance within normal limits.

Right Kidney: Length: 11.4 cm. Echogenicity within normal limits. No
mass or hydronephrosis visualized.

Left Kidney: Length: 12.5 cm. Echogenicity within normal limits. No
mass or hydronephrosis visualized.

Abdominal aorta: No aneurysm visualized.

Other findings: None.
IMPRESSION: Normal exam.

## 2021-08-31 ENCOUNTER — Other Ambulatory Visit: Payer: Self-pay | Admitting: Obstetrics and Gynecology

## 2021-09-06 ENCOUNTER — Encounter (HOSPITAL_BASED_OUTPATIENT_CLINIC_OR_DEPARTMENT_OTHER): Payer: Self-pay | Admitting: Obstetrics and Gynecology

## 2021-09-06 DIAGNOSIS — Z01812 Encounter for preprocedural laboratory examination: Secondary | ICD-10-CM | POA: Diagnosis not present

## 2021-09-07 ENCOUNTER — Other Ambulatory Visit: Payer: Self-pay

## 2021-09-07 ENCOUNTER — Encounter (HOSPITAL_BASED_OUTPATIENT_CLINIC_OR_DEPARTMENT_OTHER): Payer: Self-pay | Admitting: Obstetrics and Gynecology

## 2021-09-07 NOTE — Progress Notes (Addendum)
Spoke w/ via phone for pre-op interview---pt Lab needs dos----     urine preg poct          Lab results------lab appt 09-11-2021 930 for cbc bmp t & s COVID test -----patient states asymptomatic no test needed Arrive at -------1130 am 09-14-2021 NPO after MN NO Solid Food.  Clear liquids from MN until---1030 am Med rec completed Medications to take morning of surgery -----Euthrox, Norethidrone, Famotidine Diabetic medication -----n/a Patient instructed no nail polish to be worn day of surgery Patient instructed to bring photo id and insurance card day of surgery Patient aware to have Driver (ride ) / caregiver    for 24 hours after surgery spouse husband Lisa Wolf  Patient Special Instructions -----pt given extended recovery instructions Pre-Op special Istructions -----no smoking 24 hours before surgery Patient verbalized understanding of instructions that were given at this phone interview. Patient denies shortness of breath, chest pain, fever, cough at this phone interview.

## 2021-09-07 NOTE — Progress Notes (Signed)
PLEASE WEAR A MASK OUT IN PUBLIC AND SOCIAL DISTANCE AND [place] YOUR HANDS FREQUENTLY. PLEASE ASK ALL YOUR CLOSE HOUSEHOLD CONTACT TO WEAR MASK OUT IN PUBLIC AND SOCIAL DISTANCE AND [place] HANDS FREQUENTLY ALSO.      Your procedure is scheduled on 09-14-2021  Report to [facility].   Call this number if you have problems the morning of surgery  :[phone].   OUR ADDRESS IS [address].  WE ARE LOCATED IN [facility].  PLEASE BRING YOUR INSURANCE CARD AND PHOTO ID DAY OF SURGERY.  ONLY ONE PERSON ALLOWED IN FACILITY WAITING AREA.                                     REMEMBER:  DO NOT EAT FOOD, CANDY GUM OR MINTS  AFTER MIDNIGHT THE NIGHT BEFORE YOUR SURGERY . YOU MAY HAVE CLEAR LIQUIDS FROM MIDNIGHT THE NIGHT BEFORE YOUR SURGERY UNTIL 1030. AM. NO CLEAR LIQUIDS AFTER  1030 AM. DAY OF SURGERY.   YOU MAY  BRUSH YOUR TEETH MORNING OF SURGERY AND RINSE YOUR MOUTH OUT, NO CHEWING GUM CANDY OR MINTS.    CLEAR LIQUID DIET   Foods Allowed                                                                     Foods Excluded  Coffee and tea, regular and decaf                             liquids that you cannot  Plain Jell-O any favor except red or purple                                           see through such as: Fruit ices (not with fruit pulp)                                     milk, soups, orange juice  Iced Popsicles                                    All solid food Carbonated beverages, regular and diet                                    Cranberry, grape and apple juices Sports drinks like Gatorade  Sample Menu Breakfast                                Lunch                                     Supper  Cranberry juice                                           Jell-O                                     Grape juice                           Apple juice Coffee or tea                        Jell-O                                       Popsicle                                                Coffee or tea                        Coffee or tea  _____________________________________________________________________     TAKE THESE MEDICATIONS MORNING OF SURGERY WITH A SIP OF WATER:  EUTHROX, NORETHIDRONE, FAMOTIDINE  ONE VISITOR IS ALLOWED IN WAITING ROOM ONLY DAY OF SURGERY.      ONLY 1 VISITOR AGE 83 AND OVER MAY SPEND THE NIGHT AND MUST BE IN EXTENDED RECOVERY ROOM NO LATER THAN 800 PM .     ALL PERSONS VISITING IN EXTENDED RECOVERY ROOM MUST WEAR A MASK.                                    DO NOT WEAR JEWERLY, MAKE UP. DO NOT WEAR LOTIONS, POWDERS, PERFUMES OR NAIL POLISH ON YOUR FINGERNAILS. TOENAIL POLISH IS OK TO WEAR. DO NOT SHAVE FOR 48 HOURS PRIOR TO DAY OF SURGERY. MEN MAY SHAVE FACE AND NECK. CONTACTS, GLASSES, OR DENTURES MAY NOT BE WORN TO SURGERY.                                    [facility] IS NOT RESPONSIBLE  FOR ANY BELONGINGS.                                                                    Marland Kitchen           [facility] - Preparing for Surgery Before surgery, you can play an important role.  Because skin is not sterile, your skin needs to be as free of germs as possible.  You can reduce the number of germs on your skin by washing with CHG (chlorahexidine gluconate) soap before surgery.  CHG is an antiseptic cleaner which kills germs and bonds  with the skin to continue killing germs even after washing. Please DO NOT use if you have an allergy to CHG or antibacterial soaps.  If your skin becomes reddened/irritated stop using the CHG and inform your nurse when you arrive at Short Stay. Do not shave (including legs and underarms) for at least 48 hours prior to the first CHG shower.  You may shave your face/neck. Please follow these instructions carefully:  1.  Shower with CHG Soap the night before surgery and the  morning of Surgery.  2.  If you choose to wash your hair, wash your hair first as usual with your   normal  shampoo.  3.  After you shampoo, rinse your hair and body thoroughly to remove the  shampoo.                            4.  Use CHG as you would any other liquid soap.  You can apply chg directly  to the skin and wash                      Gently with a scrungie or clean washcloth.  5.  Apply the CHG Soap to your body ONLY FROM THE NECK DOWN.   Do not use on face/ open                           Wound or open sores. Avoid contact with eyes, ears mouth and genitals (private parts).                       Wash face,  Genitals (private parts) with your normal soap.             6.  Wash thoroughly, paying special attention to the area where your surgery  will be performed.  7.  Thoroughly rinse your body with warm water from the neck down.  8.  DO NOT shower/wash with your normal soap after using and rinsing off  the CHG Soap.                9.  Pat yourself dry with a clean towel.            10.  Wear clean pajamas.            11.  Place clean sheets on your bed the night of your first shower and do not  sleep with pets. Day of Surgery : Do not apply any lotions/deodorants the morning of surgery.  Please wear clean clothes to the hospital/surgery center.  IF YOU HAVE ANY SKIN IRRITATION OR PROBLEMS WITH THE SURGICAL SOAP, PLEASE GET A BAR OF GOLD DIAL SOAP AND SHOWER THE NIGHT BEFORE YOUR SURGERY AND THE MORNING OF YOUR SURGERY. PLEASE LET THE NURSE KNOW MORNING OF YOUR SURGERY IF YOU HAD ANY PROBLEMS WITH THE SURGICAL SOAP.  FAILURE TO FOLLOW THESE INSTRUCTIONS MAY RESULT IN THE CANCELLATION OF YOUR SURGERY PATIENT SIGNATURE_________________________________  NURSE SIGNATURE__________________________________  ________________________________________________________________________                                                        QUESTIONS Hansel Feinstein PRE OP NURSE PHONE [phone].

## 2021-09-11 ENCOUNTER — Encounter (HOSPITAL_COMMUNITY)
Admission: RE | Admit: 2021-09-11 | Discharge: 2021-09-11 | Disposition: A | Discharge status: Home / Self Care | Payer: Commercial Managed Care - PPO | Source: Ambulatory Visit | Attending: Obstetrics and Gynecology | Admitting: Obstetrics and Gynecology

## 2021-09-11 ENCOUNTER — Other Ambulatory Visit: Payer: Self-pay

## 2021-09-11 DIAGNOSIS — Z01812 Encounter for preprocedural laboratory examination: Secondary | ICD-10-CM | POA: Diagnosis not present

## 2021-09-11 LAB — BASIC METABOLIC PANEL
Anion gap: 7 (ref 5–15)
BUN: 9 mg/dL (ref 6–20)
CO2: 22 mmol/L (ref 22–32)
Calcium: 8.7 mg/dL — ABNORMAL LOW (ref 8.9–10.3)
Chloride: 105 mmol/L (ref 98–111)
Creatinine, Ser: 0.72 mg/dL (ref 0.44–1.00)
GFR, Estimated: >60 mL/min (ref >60)
Glucose, Bld: 86 mg/dL (ref 70–99)
Potassium: 4.3 mmol/L (ref 3.5–5.1)
Sodium: 134 mmol/L — ABNORMAL LOW (ref 135–145)

## 2021-09-11 LAB — CBC
HCT: 37.1 % (ref 36.0–46.0)
Hemoglobin: 11.8 g/dL — ABNORMAL LOW (ref 12.0–15.0)
MCH: 26.9 pg (ref 26.0–34.0)
MCHC: 31.8 g/dL (ref 30.0–36.0)
MCV: 84.7 fL (ref 80.0–100.0)
Platelets: 417 10*3/uL — ABNORMAL HIGH (ref 150–400)
RBC: 4.38 MIL/uL (ref 3.87–5.11)
RDW: 14.6 % (ref 11.5–15.5)
WBC: 8.2 10*3/uL (ref 4.0–10.5)
nRBC: 0 % (ref 0.0–0.2)

## 2021-09-14 ENCOUNTER — Ambulatory Visit (HOSPITAL_BASED_OUTPATIENT_CLINIC_OR_DEPARTMENT_OTHER)
Admission: RE | Admit: 2021-09-14 | Discharge: 2021-09-15 | Disposition: A | Discharge status: Home / Self Care | Payer: Commercial Managed Care - PPO | Source: Ambulatory Visit | Attending: Obstetrics and Gynecology | Admitting: Obstetrics and Gynecology

## 2021-09-14 ENCOUNTER — Encounter (HOSPITAL_BASED_OUTPATIENT_CLINIC_OR_DEPARTMENT_OTHER): Payer: Self-pay | Admitting: Obstetrics and Gynecology

## 2021-09-14 ENCOUNTER — Ambulatory Visit (HOSPITAL_BASED_OUTPATIENT_CLINIC_OR_DEPARTMENT_OTHER): Payer: Commercial Managed Care - PPO | Admitting: Anesthesiology

## 2021-09-14 ENCOUNTER — Encounter (HOSPITAL_BASED_OUTPATIENT_CLINIC_OR_DEPARTMENT_OTHER)
Admission: RE | Disposition: A | Discharge status: Home / Self Care | Payer: Self-pay | Source: Ambulatory Visit | Attending: Obstetrics and Gynecology

## 2021-09-14 DIAGNOSIS — D219 Benign neoplasm of connective and other soft tissue, unspecified: Secondary | ICD-10-CM | POA: Diagnosis present

## 2021-09-14 DIAGNOSIS — E039 Hypothyroidism, unspecified: Secondary | ICD-10-CM | POA: Insufficient documentation

## 2021-09-14 DIAGNOSIS — N815 Vaginal enterocele: Secondary | ICD-10-CM | POA: Diagnosis not present

## 2021-09-14 DIAGNOSIS — N736 Female pelvic peritoneal adhesions (postinfective): Secondary | ICD-10-CM | POA: Insufficient documentation

## 2021-09-14 DIAGNOSIS — D259 Leiomyoma of uterus, unspecified: Secondary | ICD-10-CM | POA: Diagnosis not present

## 2021-09-14 DIAGNOSIS — K219 Gastro-esophageal reflux disease without esophagitis: Secondary | ICD-10-CM | POA: Insufficient documentation

## 2021-09-14 DIAGNOSIS — F17210 Nicotine dependence, cigarettes, uncomplicated: Secondary | ICD-10-CM | POA: Insufficient documentation

## 2021-09-14 DIAGNOSIS — Z8616 Personal history of COVID-19: Secondary | ICD-10-CM | POA: Insufficient documentation

## 2021-09-14 HISTORY — DX: Hypothyroidism, unspecified: E03.9

## 2021-09-14 HISTORY — PX: ROBOTIC ASSISTED LAPAROSCOPIC HYSTERECTOMY AND SALPINGECTOMY: SHX6379

## 2021-09-14 HISTORY — DX: Concussion with loss of consciousness status unknown, initial encounter: S06.0XAA

## 2021-09-14 HISTORY — DX: Unspecified osteoarthritis, unspecified site: M19.90

## 2021-09-14 HISTORY — DX: Vitamin D deficiency, unspecified: E55.9

## 2021-09-14 HISTORY — DX: Leiomyoma of uterus, unspecified: D25.9

## 2021-09-14 LAB — TYPE AND SCREEN
ABO/RH(D): O POS
Antibody Screen: NEGATIVE

## 2021-09-14 LAB — POCT PREGNANCY, URINE: Preg Test, Ur: NEGATIVE

## 2021-09-14 SURGERY — XI ROBOTIC ASSISTED LAPAROSCOPIC HYSTERECTOMY AND SALPINGECTOMY
Anesthesia: General | Site: Abdomen | Laterality: Bilateral

## 2021-09-14 MED ORDER — ROCURONIUM BROMIDE 100 MG/10ML IV SOLN
INTRAVENOUS | Status: DC | PRN
  Administered 2021-09-14: 10 mg via INTRAVENOUS
  Administered 2021-09-14: 100 mg via INTRAVENOUS

## 2021-09-14 MED ORDER — EPHEDRINE SULFATE (PRESSORS) 50 MG/ML IJ SOLN
INTRAMUSCULAR | Status: DC | PRN
  Administered 2021-09-14: 5 mg via INTRAVENOUS

## 2021-09-14 MED ORDER — KETAMINE HCL 50 MG/5ML IJ SOSY
PREFILLED_SYRINGE | INTRAMUSCULAR | Status: AC
  Filled 2021-09-14: qty 5

## 2021-09-14 MED ORDER — FENTANYL CITRATE (PF) 250 MCG/5ML IJ SOLN
INTRAMUSCULAR | Status: AC
  Filled 2021-09-14: qty 5

## 2021-09-14 MED ORDER — OXYCODONE HCL 5 MG PO TABS: 5.0000 mg | ORAL_TABLET | Freq: Once | ORAL | Status: DC | PRN

## 2021-09-14 MED ORDER — SUGAMMADEX SODIUM 200 MG/2ML IV SOLN
INTRAVENOUS | Status: DC | PRN
  Administered 2021-09-14: 300 mg via INTRAVENOUS

## 2021-09-14 MED ORDER — HYDROMORPHONE HCL 1 MG/ML IJ SOLN
INTRAMUSCULAR | Status: AC
  Filled 2021-09-14: qty 1

## 2021-09-14 MED ORDER — LIDOCAINE HCL (PF) 2 % IJ SOLN
INTRAMUSCULAR | Status: AC
  Filled 2021-09-14: qty 10

## 2021-09-14 MED ORDER — EPHEDRINE 5 MG/ML INJ
INTRAVENOUS | Status: AC
  Filled 2021-09-14: qty 5

## 2021-09-14 MED ORDER — IBUPROFEN 200 MG PO TABS: 600.0000 mg | ORAL_TABLET | Freq: Four times a day (QID) | ORAL | Status: DC

## 2021-09-14 MED ORDER — CEFAZOLIN SODIUM-DEXTROSE 2-4 GM/100ML-% IV SOLN
2.0000 g | INTRAVENOUS | Status: AC
  Administered 2021-09-14: 2 g via INTRAVENOUS

## 2021-09-14 MED ORDER — KETOROLAC TROMETHAMINE 30 MG/ML IJ SOLN
INTRAMUSCULAR | Status: AC
  Filled 2021-09-14: qty 1

## 2021-09-14 MED ORDER — SODIUM CHLORIDE 0.9 % IV SOLN
INTRAVENOUS | Status: DC | PRN
  Administered 2021-09-14: 60 mL

## 2021-09-14 MED ORDER — POVIDONE-IODINE 10 % EX SWAB
2.0000 "application " | Freq: Once | CUTANEOUS | Status: AC
  Administered 2021-09-14: 2 via TOPICAL

## 2021-09-14 MED ORDER — OXYCODONE HCL 5 MG/5ML PO SOLN: 5.0000 mg | Freq: Once | ORAL | Status: DC | PRN

## 2021-09-14 MED ORDER — ACETAMINOPHEN 500 MG PO TABS
ORAL_TABLET | ORAL | Status: AC
  Filled 2021-09-14: qty 2

## 2021-09-14 MED ORDER — KETAMINE HCL 10 MG/ML IJ SOLN
INTRAMUSCULAR | Status: DC | PRN
  Administered 2021-09-14: 40 mg via INTRAVENOUS
  Administered 2021-09-14: 10 mg via INTRAVENOUS

## 2021-09-14 MED ORDER — ACETAMINOPHEN 500 MG PO TABS
1000.0000 mg | ORAL_TABLET | Freq: Four times a day (QID) | ORAL | Status: DC
  Administered 2021-09-14 – 2021-09-15 (×3): 1000 mg via ORAL

## 2021-09-14 MED ORDER — MIDAZOLAM HCL 2 MG/2ML IJ SOLN
INTRAMUSCULAR | Status: AC
  Filled 2021-09-14: qty 2

## 2021-09-14 MED ORDER — KETOROLAC TROMETHAMINE 30 MG/ML IJ SOLN: 30.0000 mg | Freq: Once | INTRAMUSCULAR | Status: AC | PRN

## 2021-09-14 MED ORDER — MIDAZOLAM HCL 2 MG/2ML IJ SOLN
INTRAMUSCULAR | Status: DC | PRN
  Administered 2021-09-14: 2 mg via INTRAVENOUS

## 2021-09-14 MED ORDER — FENTANYL CITRATE (PF) 100 MCG/2ML IJ SOLN
INTRAMUSCULAR | Status: DC | PRN
  Administered 2021-09-14 (×5): 50 ug via INTRAVENOUS

## 2021-09-14 MED ORDER — PROPOFOL 10 MG/ML IV BOLUS
INTRAVENOUS | Status: DC | PRN
  Administered 2021-09-14: 150 mg via INTRAVENOUS
  Administered 2021-09-14: 10 mg via INTRAVENOUS
  Administered 2021-09-14: 20 mg via INTRAVENOUS
  Administered 2021-09-14: 30 mg via INTRAVENOUS
  Administered 2021-09-14: 20 mg via INTRAVENOUS

## 2021-09-14 MED ORDER — ONDANSETRON HCL 4 MG/2ML IJ SOLN
INTRAMUSCULAR | Status: AC
  Filled 2021-09-14: qty 4

## 2021-09-14 MED ORDER — MEPERIDINE HCL 25 MG/ML IJ SOLN: 6.2500 mg | INTRAMUSCULAR | Status: DC | PRN

## 2021-09-14 MED ORDER — CEFAZOLIN SODIUM-DEXTROSE 2-4 GM/100ML-% IV SOLN
INTRAVENOUS | Status: AC
  Filled 2021-09-14: qty 100

## 2021-09-14 MED ORDER — SODIUM CHLORIDE 0.9 % IR SOLN
Status: DC | PRN
  Administered 2021-09-14: 1000 mL

## 2021-09-14 MED ORDER — KETOROLAC TROMETHAMINE 30 MG/ML IJ SOLN
30.0000 mg | Freq: Four times a day (QID) | INTRAMUSCULAR | Status: DC
  Administered 2021-09-14 – 2021-09-15 (×2): 30 mg via INTRAVENOUS

## 2021-09-14 MED ORDER — OXYCODONE HCL 5 MG PO TABS
ORAL_TABLET | ORAL | Status: AC
  Filled 2021-09-14: qty 1

## 2021-09-14 MED ORDER — SCOPOLAMINE 1 MG/3DAYS TD PT72
1.0000 | MEDICATED_PATCH | TRANSDERMAL | Status: DC
  Administered 2021-09-14: 1.5 mg via TRANSDERMAL

## 2021-09-14 MED ORDER — DEXAMETHASONE SODIUM PHOSPHATE 10 MG/ML IJ SOLN
INTRAMUSCULAR | Status: AC
  Filled 2021-09-14: qty 2

## 2021-09-14 MED ORDER — ACETAMINOPHEN 500 MG PO TABS
1000.0000 mg | ORAL_TABLET | Freq: Once | ORAL | Status: AC
  Administered 2021-09-14: 1000 mg via ORAL

## 2021-09-14 MED ORDER — ROCURONIUM BROMIDE 10 MG/ML (PF) SYRINGE
PREFILLED_SYRINGE | INTRAVENOUS | Status: AC
  Filled 2021-09-14: qty 10

## 2021-09-14 MED ORDER — LACTATED RINGERS IV SOLN: INTRAVENOUS | Status: DC

## 2021-09-14 MED ORDER — PROMETHAZINE HCL 25 MG/ML IJ SOLN: 6.2500 mg | INTRAMUSCULAR | Status: DC | PRN

## 2021-09-14 MED ORDER — SCOPOLAMINE 1 MG/3DAYS TD PT72
MEDICATED_PATCH | TRANSDERMAL | Status: AC
  Filled 2021-09-14: qty 1

## 2021-09-14 MED ORDER — DEXTROSE IN LACTATED RINGERS 5 % IV SOLN: INTRAVENOUS | Status: DC

## 2021-09-14 MED ORDER — DEXAMETHASONE SODIUM PHOSPHATE 4 MG/ML IJ SOLN
INTRAMUSCULAR | Status: DC | PRN
  Administered 2021-09-14: 8 mg via INTRAVENOUS

## 2021-09-14 MED ORDER — OXYCODONE HCL 5 MG PO TABS
5.0000 mg | ORAL_TABLET | ORAL | Status: DC | PRN
  Administered 2021-09-14 – 2021-09-15 (×4): 5 mg via ORAL

## 2021-09-14 MED ORDER — STERILE WATER FOR IRRIGATION IR SOLN
Status: DC | PRN
  Administered 2021-09-14: 500 mL

## 2021-09-14 MED ORDER — GLYCOPYRROLATE PF 0.2 MG/ML IJ SOSY
PREFILLED_SYRINGE | INTRAMUSCULAR | Status: AC
  Filled 2021-09-14: qty 1

## 2021-09-14 MED ORDER — PHENYLEPHRINE 40 MCG/ML (10ML) SYRINGE FOR IV PUSH (FOR BLOOD PRESSURE SUPPORT)
PREFILLED_SYRINGE | INTRAVENOUS | Status: AC
  Filled 2021-09-14: qty 10

## 2021-09-14 MED ORDER — BUPIVACAINE HCL (PF) 0.25 % IJ SOLN
INTRAMUSCULAR | Status: DC | PRN
  Administered 2021-09-14: 9 mL

## 2021-09-14 MED ORDER — FAMOTIDINE 20 MG PO TABS: 20.0000 mg | ORAL_TABLET | ORAL | Status: DC | PRN

## 2021-09-14 MED ORDER — LIDOCAINE HCL (CARDIAC) PF 100 MG/5ML IV SOSY
PREFILLED_SYRINGE | INTRAVENOUS | Status: DC | PRN
  Administered 2021-09-14: 60 mg via INTRAVENOUS

## 2021-09-14 MED ORDER — AMISULPRIDE (ANTIEMETIC) 5 MG/2ML IV SOLN: 10.0000 mg | Freq: Once | INTRAVENOUS | Status: DC | PRN

## 2021-09-14 MED ORDER — GLYCOPYRROLATE 0.2 MG/ML IJ SOLN
INTRAMUSCULAR | Status: DC | PRN
  Administered 2021-09-14 (×2): .1 mg via INTRAVENOUS

## 2021-09-14 MED ORDER — LEVOTHYROXINE SODIUM 137 MCG PO TABS
137.0000 ug | ORAL_TABLET | Freq: Every day | ORAL | Status: DC
  Administered 2021-09-15: 137 ug via ORAL
  Filled 2021-09-14 (×3): qty 1

## 2021-09-14 MED ORDER — KETOROLAC TROMETHAMINE 30 MG/ML IJ SOLN
INTRAMUSCULAR | Status: DC | PRN
Start: 2021-09-14 — End: 2021-09-14
  Administered 2021-09-14: 30 mg via INTRAVENOUS

## 2021-09-14 MED ORDER — HYDROMORPHONE HCL 1 MG/ML IJ SOLN
0.2500 mg | INTRAMUSCULAR | Status: DC | PRN
  Administered 2021-09-14: 0.25 mg via INTRAVENOUS

## 2021-09-14 SURGICAL SUPPLY — 60 items
ADH SKN CLS APL DERMABOND .7 (GAUZE/BANDAGES/DRESSINGS) ×2
BAG DECANTER FOR FLEXI CONT (MISCELLANEOUS) ×1 IMPLANT
CATH FOLEY 3WAY  5CC 16FR (CATHETERS) ×2
CATH FOLEY 3WAY 5CC 16FR (CATHETERS) ×1 IMPLANT
COVER BACK TABLE 60X90IN (DRAPES) ×2 IMPLANT
COVER TIP SHEARS 8 DVNC (MISCELLANEOUS) ×1 IMPLANT
COVER TIP SHEARS 8MM DA VINCI (MISCELLANEOUS) ×2
DECANTER SPIKE VIAL GLASS SM (MISCELLANEOUS) ×4 IMPLANT
DEFOGGER SCOPE WARMER CLEARIFY (MISCELLANEOUS) ×2 IMPLANT
DERMABOND ADVANCED (GAUZE/BANDAGES/DRESSINGS) ×2
DERMABOND ADVANCED .7 DNX12 (GAUZE/BANDAGES/DRESSINGS) ×1 IMPLANT
DRAPE ARM DVNC X/XI (DISPOSABLE) ×4 IMPLANT
DRAPE COLUMN DVNC XI (DISPOSABLE) ×1 IMPLANT
DRAPE DA VINCI XI ARM (DISPOSABLE) ×8
DRAPE DA VINCI XI COLUMN (DISPOSABLE) ×2
DRAPE UTILITY XL STRL (DRAPES) ×2 IMPLANT
DURAPREP 26ML APPLICATOR (WOUND CARE) ×2 IMPLANT
ELECT REM PT RETURN 9FT ADLT (ELECTROSURGICAL) ×2
ELECTRODE REM PT RTRN 9FT ADLT (ELECTROSURGICAL) ×1 IMPLANT
GAUZE 4X4 16PLY ~~LOC~~+RFID DBL (SPONGE) ×5 IMPLANT
GLOVE SURG ENC MOIS LTX SZ6.5 (GLOVE) ×1 IMPLANT
GLOVE SURG ENC MOIS LTX SZ7 (GLOVE) IMPLANT
GLOVE SURG ENC MOIS LTX SZ7.5 (GLOVE) ×6 IMPLANT
GLOVE SURG NEOPR MICRO LF SZ6 (GLOVE) ×1 IMPLANT
GLOVE SURG UNDER POLY LF SZ6 (GLOVE) ×1 IMPLANT
GLOVE SURG UNDER POLY LF SZ6.5 (GLOVE) ×4 IMPLANT
GLOVE SURG UNDER POLY LF SZ7 (GLOVE) ×3 IMPLANT
GOWN STRL REUS W/TWL XL LVL3 (GOWN DISPOSABLE) ×1 IMPLANT
HOLDER FOLEY CATH W/STRAP (MISCELLANEOUS) ×1 IMPLANT
IRRIG SUCT STRYKERFLOW 2 WTIP (MISCELLANEOUS) ×2
IRRIGATION SUCT STRKRFLW 2 WTP (MISCELLANEOUS) ×1 IMPLANT
IV NS 1000ML (IV SOLUTION) ×2
IV NS 1000ML BAXH (IV SOLUTION) IMPLANT
KIT TURNOVER CYSTO (KITS) ×2 IMPLANT
MANIFOLD NEPTUNE II (INSTRUMENTS) ×1 IMPLANT
NEEDLE INSUFFLATION 150MM (ENDOMECHANICALS) ×2 IMPLANT
OBTURATOR OPTICAL STANDARD 8MM (TROCAR) ×2
OBTURATOR OPTICAL STND 8 DVNC (TROCAR) ×1
OBTURATOR OPTICALSTD 8 DVNC (TROCAR) ×1 IMPLANT
OCCLUDER COLPOPNEUMO (BALLOONS) ×2 IMPLANT
PACK ROBOT WH (CUSTOM PROCEDURE TRAY) ×2 IMPLANT
PACK ROBOTIC GOWN (GOWN DISPOSABLE) ×2 IMPLANT
PACK TRENDGUARD 450 HYBRID PRO (MISCELLANEOUS) IMPLANT
PAD OB MATERNITY 4.3X12.25 (PERSONAL CARE ITEMS) ×2 IMPLANT
PAD PREP 24X48 CUFFED NSTRL (MISCELLANEOUS) ×2 IMPLANT
PROTECTOR NERVE ULNAR (MISCELLANEOUS) ×4 IMPLANT
SEAL CANN UNIV 5-8 DVNC XI (MISCELLANEOUS) ×3 IMPLANT
SEAL XI 5MM-8MM UNIVERSAL (MISCELLANEOUS) ×8
SET TRI-LUMEN FLTR TB AIRSEAL (TUBING) ×2 IMPLANT
SPONGE T-LAP 4X18 ~~LOC~~+RFID (SPONGE) ×2 IMPLANT
SUT VIC AB 0 CT1 27 (SUTURE) ×2
SUT VIC AB 0 CT1 27XBRD ANBCTR (SUTURE) ×1 IMPLANT
SUT VICRYL RAPIDE 4/0 PS 2 (SUTURE) ×4 IMPLANT
SUT VLOC 180 0 9IN  GS21 (SUTURE) ×2
SUT VLOC 180 0 9IN GS21 (SUTURE) ×1 IMPLANT
TIP UTERINE 6.7X10CM GRN DISP (MISCELLANEOUS) ×1 IMPLANT
TOWEL OR 17X26 10 PK STRL BLUE (TOWEL DISPOSABLE) ×2 IMPLANT
TRENDGUARD 450 HYBRID PRO PACK (MISCELLANEOUS) ×2
TROCAR PORT AIRSEAL 8X120 (TROCAR) ×2 IMPLANT
WATER STERILE IRR 500ML POUR (IV SOLUTION) ×1 IMPLANT

## 2021-09-14 NOTE — Anesthesia Postprocedure Evaluation (Signed)
Anesthesia Post Note  Patient: ANNAMARIA SALAH  Procedure(s) Performed: XI ROBOTIC ASSISTED LAPAROSCOPIC HYSTERECTOMY AND SALPINGECTOMY (Bilateral: Abdomen)     Patient location during evaluation: PACU Anesthesia Type: General Level of consciousness: sedated Pain management: pain level controlled Vital Signs Assessment: post-procedure vital signs reviewed and stable Respiratory status: spontaneous breathing and respiratory function stable Cardiovascular status: stable Postop Assessment: no apparent nausea or vomiting Anesthetic complications: no   No notable events documented.  Last Vitals:  Vitals:   09/14/21 1715 09/14/21 1738  BP: 121/70 131/83  Pulse: 75 72  Resp: 20 18  Temp: 37 C   SpO2: 97% 98%    Last Pain:  Vitals:   09/14/21 1715  TempSrc:   PainSc: 4                  Londin Antone DANIEL

## 2021-09-14 NOTE — Anesthesia Procedure Notes (Signed)
Procedure Name: Intubation Date/Time: 09/14/2021 1:28 PM Performed by: Georgeanne Nim, CRNA Pre-anesthesia Checklist: Patient identified, Emergency Drugs available, Suction available, Patient being monitored and Timeout performed Patient Re-evaluated:Patient Re-evaluated prior to induction Oxygen Delivery Method: Circle system utilized Preoxygenation: Pre-oxygenation with 100% oxygen Induction Type: IV induction Ventilation: Mask ventilation without difficulty Laryngoscope Size: Mac and 4 Grade View: Grade I Tube type: Oral Number of attempts: 1 Airway Equipment and Method: Stylet Placement Confirmation: ETT inserted through vocal cords under direct vision, positive ETCO2, CO2 detector and breath sounds checked- equal and bilateral Secured at: 20 cm Tube secured with: Tape Dental Injury: Teeth and Oropharynx as per pre-operative assessment

## 2021-09-14 NOTE — H&P (Signed)
Lisa Wolf is an 41 y.o. female. Menometrorrhagia with fibroids and failed EAB for definitive surgery  Pertinent Gynecological History: Menses: flow is moderate Bleeding: intermenstrual bleeding Contraception: none DES exposure: denies Blood transfusions: none Sexually transmitted diseases: no past history Previous GYN Procedures: DNC  Last mammogram: normal Date: 2022 Last pap: normal Date: 2022 OB History: G2, P1   Menstrual History: Menarche age: 53 No LMP recorded.    Past Medical History:  Diagnosis Date   Arthritis    oa knees   Concussion    as teenager no deficits from   [place] 09/20/2019   loss of taste and smell headache x 2 3 days all symptoms resolved   GERD (gastroesophageal reflux disease)    History of pregnancy induced hypertension 2013   Hypothyroidism    Migraine    occ   Neuromuscular disorder ([place])    2 pinched discs lower back   Uterine fibroid    Vitamin D deficiency     Past Surgical History:  Procedure Laterality Date   CESAREAN SECTION  06/24/2012   Procedure: CESAREAN SECTION;  Surgeon: Lovenia Kim, MD;  Location: [facility];  Service: Obstetrics;  Laterality: N/A;   CHOLECYSTECTOMY  05/2019   laparoscopic   DILATATION & CURETTAGE/HYSTEROSCOPY WITH MYOSURE N/A 06/16/2020   Procedure: DILATATION & CURETTAGE/HYSTEROSCOPY WITH MYOSURE;  Surgeon: Brien Few, MD;  Location: [facility];  Service: Gynecology;  Laterality: N/A;   LAPAROSCOPIC APPENDECTOMY N/A 09/08/2015   Procedure: APPENDECTOMY LAPAROSCOPIC;  Surgeon: Aviva Signs, MD;  Location: [facility];  Service: General;  Laterality: N/A;   WISDOM TOOTH EXTRACTION     yrs ago per pt on 09-07-2021    Family History  Problem Relation Age of Onset   Asthma Mother    Healthy Father     Social History:  reports that she has been smoking cigarettes. She has a 10.00 pack-year smoking history. She has never used smokeless tobacco. She reports current drug use. She reports  that she does not drink alcohol.  Allergies:  Allergies  Allergen Reactions   Ciprofloxacin     nerulogical symptoms    No medications prior to admission.    Review of Systems  Constitutional: Negative.   All other systems reviewed and are negative.  Height 5\' 7"  (1.702 m), weight 90.7 kg, unknown if currently breastfeeding. Physical Exam Constitutional:      Appearance: Normal appearance.  HENT:     Head: Normocephalic and atraumatic.  Cardiovascular:     Rate and Rhythm: Normal rate and regular rhythm.     Pulses: Normal pulses.     Heart sounds: Normal heart sounds.  Pulmonary:     Effort: Pulmonary effort is normal.     Breath sounds: Normal breath sounds.  Abdominal:     General: Bowel sounds are normal.     Palpations: Abdomen is soft.  Genitourinary:    General: Normal vulva.  Musculoskeletal:        General: Normal range of motion.     Cervical back: Normal range of motion and neck supple.  Skin:    General: Skin is warm and dry.  Neurological:     General: No focal deficit present.     Mental Status: She is alert and oriented to person, place, and time.  Psychiatric:        Mood and Affect: Mood normal.        Behavior: Behavior normal.    No results found for this or  any previous visit (from the past 24 hour(s)).  No results found.  Assessment/Plan: Menometrorrhagia with SM fibroids Failed EAB daVinci TLH with bilateral salpingectomy Surgical risks discussed. Poss need for TAH. Consent done.   Jacarri Gesner J 09/14/2021, 6:53 AM

## 2021-09-14 NOTE — Anesthesia Preprocedure Evaluation (Addendum)
Anesthesia Evaluation  Patient identified by MRN, date of birth, ID band Patient awake    Reviewed: Allergy & Precautions, NPO status , Patient's Chart, lab work & pertinent test results  Airway Mallampati: I  TM Distance: >3 FB Neck ROM: Full    Dental no notable dental hx. (+) Teeth Intact, Dental Advisory Given   Pulmonary Current Smoker,  10 pack year history    Pulmonary exam normal breath sounds clear to auscultation       Cardiovascular negative cardio ROS Normal cardiovascular exam Rhythm:Regular Rate:Normal     Neuro/Psych  Headaches, negative psych ROS   GI/Hepatic Neg liver ROS, GERD  Controlled,  Endo/Other  Hypothyroidism   Renal/GU negative Renal ROS  Female GU complaint     Musculoskeletal  (+) Arthritis , Osteoarthritis,    Abdominal   Peds  Hematology negative hematology ROS (+) hct 37.1, plt 417   Anesthesia Other Findings   Reproductive/Obstetrics negative OB ROS                            Anesthesia Physical Anesthesia Plan  ASA: 2  Anesthesia Plan: General   Post-op Pain Management: Tylenol PO (pre-op) and Toradol IV (intra-op)   Induction: Intravenous  PONV Risk Score and Plan: 3 and Ondansetron, Dexamethasone, Midazolam, Scopolamine patch - Pre-op and Treatment may vary due to age or medical condition  Airway Management Planned: Oral ETT  Additional Equipment: None  Intra-op Plan:   Post-operative Plan: Extubation in OR  Informed Consent: I have reviewed the patients History and Physical, chart, labs and discussed the procedure including the risks, benefits and alternatives for the proposed anesthesia with the patient or authorized representative who has indicated his/her understanding and acceptance.     Dental advisory given  Plan Discussed with: CRNA  Anesthesia Plan Comments:        Anesthesia Quick Evaluation

## 2021-09-14 NOTE — Op Note (Signed)
09/14/2021  3:25 PM  PATIENT:  Lisa Wolf  41 y.o. female  PRE-OPERATIVE DIAGNOSIS:  Uterine Fibroids  POST-OPERATIVE DIAGNOSIS:  Uterine Fibroids ENTEROCELE LEFT ADNEXAL ADHESIONS  PROCEDURE:  Procedure(s): XI ROBOTIC ASSISTED LAPAROSCOPIC HYSTERECTOMY BILATERAL SALPINGECTOMY LYSIS OF LEFT ADNEXAL ADHESIONS MCCALL CUL DE PLASTY SURGEON:  Surgeon(s): Brien Few, MD  ASSISTANTSEddie Dibbles, CNM   ANESTHESIA:   local and general  ESTIMATED BLOOD LOSS: 100   DRAINS: Urinary Catheter (Foley)   LOCAL MEDICATIONS USED:  MARCAINE    and Amount: 20 ml  SPECIMEN:  Source of Specimen:  UTERUS, CERVIX, TUBES , FIBROIDS  DISPOSITION OF SPECIMEN:  PATHOLOGY  COUNTS:  YES  DICTATION #: [id number]  PLAN OF CARE: DC HOME IN AM  PATIENT DISPOSITION:  PACU - hemodynamically stable.

## 2021-09-14 NOTE — Transfer of Care (Signed)
Immediate Anesthesia Transfer of Care Note  Patient: SIGNORA ZUCCO  Procedure(s) Performed: XI ROBOTIC ASSISTED LAPAROSCOPIC HYSTERECTOMY AND SALPINGECTOMY (Bilateral: Abdomen)  Patient Location: PACU  Anesthesia Type:General  Level of Consciousness: awake, alert , oriented and patient cooperative  Airway & Oxygen Therapy: Patient Spontanous Breathing and Patient connected to nasal cannula oxygen  Post-op Assessment: Report given to RN and Post -op Vital signs reviewed and stable  Post vital signs: Reviewed and stable  Last Vitals:  Vitals Value Taken Time  BP 128/74 09/14/21 1546  Temp    Pulse 65 09/14/21 1551  Resp 18 09/14/21 1551  SpO2 99 % 09/14/21 1551  Vitals shown include unvalidated device data.  Last Pain:  Vitals:   09/14/21 1152  TempSrc: Oral  PainSc: 0-No pain      Patients Stated Pain Goal: 5 (92/49/32 4199)  Complications: No notable events documented.

## 2021-09-15 DIAGNOSIS — D259 Leiomyoma of uterus, unspecified: Secondary | ICD-10-CM | POA: Diagnosis not present

## 2021-09-15 LAB — BASIC METABOLIC PANEL
Anion gap: 7 (ref 5–15)
BUN: 9 mg/dL (ref 6–20)
CO2: 22 mmol/L (ref 22–32)
Calcium: 8.2 mg/dL — ABNORMAL LOW (ref 8.9–10.3)
Chloride: 105 mmol/L (ref 98–111)
Creatinine, Ser: 0.86 mg/dL (ref 0.44–1.00)
GFR, Estimated: >60 mL/min (ref >60)
Glucose, Bld: 156 mg/dL — ABNORMAL HIGH (ref 70–99)
Potassium: 4.8 mmol/L (ref 3.5–5.1)
Sodium: 134 mmol/L — ABNORMAL LOW (ref 135–145)

## 2021-09-15 LAB — CBC
HCT: 33.2 % — ABNORMAL LOW (ref 36.0–46.0)
Hemoglobin: 10.5 g/dL — ABNORMAL LOW (ref 12.0–15.0)
MCH: 27.3 pg (ref 26.0–34.0)
MCHC: 31.6 g/dL (ref 30.0–36.0)
MCV: 86.2 fL (ref 80.0–100.0)
Platelets: 366 10*3/uL (ref 150–400)
RBC: 3.85 MIL/uL — ABNORMAL LOW (ref 3.87–5.11)
RDW: 15.1 % (ref 11.5–15.5)
WBC: 9.5 10*3/uL (ref 4.0–10.5)
nRBC: 0 % (ref 0.0–0.2)

## 2021-09-15 MED ORDER — OXYCODONE-ACETAMINOPHEN 5-325 MG PO TABS
ORAL_TABLET | ORAL | Status: AC
  Filled 2021-09-15: qty 1

## 2021-09-15 MED ORDER — OXYCODONE-ACETAMINOPHEN 5-325 MG PO TABS: 1.0000 | ORAL_TABLET | ORAL | 0 refills | Status: AC | PRN

## 2021-09-15 MED ORDER — OXYCODONE HCL 5 MG PO TABS
ORAL_TABLET | ORAL | Status: AC
  Filled 2021-09-15: qty 1

## 2021-09-15 MED ORDER — KETOROLAC TROMETHAMINE 30 MG/ML IJ SOLN
INTRAMUSCULAR | Status: AC
  Filled 2021-09-15: qty 1

## 2021-09-15 MED ORDER — ACETAMINOPHEN 500 MG PO TABS
ORAL_TABLET | ORAL | Status: AC
  Filled 2021-09-15: qty 2

## 2021-09-15 NOTE — Op Note (Signed)
Lisa Wolf, Lisa Wolf MEDICAL RECORD NO: [id number] ACCOUNT NO: [id number] DATE OF BIRTH: [birth year] FACILITY: [facility] LOCATION: [facility] PHYSICIAN: Lovenia Kim, MD  Operative Report   DATE OF PROCEDURE: 09/14/2021  PREOPERATIVE DIAGNOSIS:  Symptomatic uterine fibroids with history of failed endometrial ablation.  POSTOPERATIVE DIAGNOSIS:  Symptomatic uterine fibroids with history of failed endometrial ablation plus left adnexal adhesions and adhesions of degenerative fibroid to bladder flap and posterior cul-de-sac.  PROCEDURES:  Da Vinci-assisted total laparoscopic hysterectomy, bilateral salpingectomy, McCall culdoplasty, lysis of left adnexal adhesions, lysis of bladder flap and left peritoneal adhesions to degenerative uterine fibroids, intraoperative myomectomy  done.  Specimen weighed 469 grams.  SURGEON:  Brien Few, MD  ASSISTANT:  Eddie Dibbles.  ANESTHESIA:  General.  ESTIMATED BLOOD LOSS:  100 mL.  COMPLICATIONS:  None.  DRAINS:  Foley.  COUNTS:  Correct.  DISPOSITION:  The patient to recovery room in good condition.  SPECIMEN:  Uterus, cervix, bilateral tubes and fibroid fragments.  BRIEF OPERATIVE NOTE:  After being apprised of the risks of anesthesia, infection, bleeding, injury to surrounding organs, possible need for repair, delayed versus immediate complications to include bowel and bladder injury, possible need for repair, the  patient was brought to the operating room where she was administered general anesthetic without complications.  Prepped and draped in usual sterile fashion.  Foley catheter placed.  RUMI retractor placed vaginally without difficulty.  Exam under  anesthesia reveals a bulky anteflexed uterus with left adnexal fullness consistent with previously known uterine fibroids.  Infraumbilical incision made with a scalpel.  Veress needle placed.  Opening pressure of -1, 6 liters of CO2 insufflated without  difficulty.  Trocar was placed  atraumatically in the midline and two to each side of the umbilicus, under direct visualization. A deep Trendelenburg position was established and visualization reveals absent appendix, absent gallbladder.  Findings include  a large fibroid that appears to be in the lower uterine segment to the anterior cervical area that is distorting the uterine anatomy.  Normal tubes, normal ovaries are apparent.  There is also a fibroid, which wraps around the left lower uterine segment  towards the posterior cul-de-sac.  At this time, EndoShears, ProGrasp and bipolar forceps, left peritoneal space was entered and dissection along the left mesosalpinx was done removing the left tube in total.  Same was done to the right tube.  The left  retroperitoneal space was further dissected and round ligament was displaced anteriorly, but opened to further confirm entry into the retroperitoneal space.  The left ovarian ligament was cauterized and cut and at this time due to distortion from this  fibroid, the adhesions are lysed sharply from this anterior left lateral fibroid, which upon further exposure is in the lower uterine segment.  After further dissection, the cervicovaginal junction was identified.  The bladder flap was further dissected.  This mass is then dissected sharply and enucleated and placed in the left lateral peritoneal cavity for removal later.  At this point, the uterine vessels were skeletonized and cauterized on the right and skeletonized and cauterized on the left.  The  posterior fibroids were also enucleated in a similar fashion and placed in the left lateral peritoneal cavity.  At this time, dissection was done down to the level of the RUMI cup.  Bladder flap was further developed.  The cervicovaginal junction was  divided using monopolar cautery and the specimen was retracted into the vagina.  The fibroid fragments x3 are then placed in the vaginal vault and  removed as well.  Irrigation was accomplished.   Good hemostasis was noted.  Ureters seen peristalsing on  the right and the left, appeared to be normal.  Left and right ovaries also appeared to be normal and left intact.  At this time, irrigation was accomplished. The vagina was closed in 2 running imbricating layers of 0 V-Loc suture.  McCall culdoplasty  suture placed. At this time, good hemostasis was noted.  Robot was undocked and all instruments were removed under direct visualization.  Dilute ropivacaine solution was placed.  All instruments were removed, positive pressure applied.  Incisions closed  with 4-0 Vicryl and Dermabond.  Vaginal exam reveals a well-approximated vaginal vault, which is also well supported.  Urine is clear and adequate.  The patient tolerated the procedure well, was awakened and transferred to recovery in good condition.   SHW D: 09/14/2021 3:32:19 pm T: 09/15/2021 12:04:00 am  JOB: [id number]/ [id number]

## 2021-09-15 NOTE — Progress Notes (Signed)
1 Day Post-Op Procedure(s) (LRB): XI ROBOTIC ASSISTED LAPAROSCOPIC HYSTERECTOMY AND SALPINGECTOMY (Bilateral)  Subjective: Patient reports nausea, incisional pain, tolerating PO, + flatus, and no problems voiding.    Objective: BP 120/70 (BP Location: Left Arm)    Pulse 72    Temp 97.8 F (36.6 C)    Resp 20    Ht 5\' 7"  (1.702 m)    Wt 93.9 kg    SpO2 98%    BMI 32.42 kg/m  CBC    Component Value Date/Time   WBC 9.5 09/15/2021 0213   RBC 3.85 (L) 09/15/2021 0213   HGB 10.5 (L) 09/15/2021 0213   HCT 33.2 (L) 09/15/2021 0213   PLT 366 09/15/2021 0213   MCV 86.2 09/15/2021 0213   MCH 27.3 09/15/2021 0213   MCHC 31.6 09/15/2021 0213   RDW 15.1 09/15/2021 0213   LYMPHSABS 3.7 09/08/2015 2035   MONOABS 0.9 09/08/2015 2035   EOSABS 0.2 09/08/2015 2035   BASOSABS 0.0 09/08/2015 2035  CMP     Component Value Date/Time   NA 134 (L) 09/15/2021 0213   K 4.8 09/15/2021 0213   CL 105 09/15/2021 0213   CO2 22 09/15/2021 0213   GLUCOSE 156 (H) 09/15/2021 0213   BUN 9 09/15/2021 0213   CREATININE 0.86 09/15/2021 0213   CALCIUM 8.2 (L) 09/15/2021 0213   PROT 6.1 06/24/2012 0935   ALBUMIN 2.5 (L) 06/24/2012 0935   AST 16 06/24/2012 0935   ALT 13 06/24/2012 0935   ALKPHOS 195 (H) 06/24/2012 0935   BILITOT 0.3 06/24/2012 0935   GFRNONAA >60 09/15/2021 0213   GFRAA >60 09/08/2015 2035     I have reviewed patient's vital signs, intake and output, medications, and labs.  General: alert, cooperative, and appears stated age Resp: clear to auscultation bilaterally Cardio: regular rate and rhythm, S1, S2 normal, no murmur, click, rub or gallop GI: soft, non-tender; bowel sounds normal; no masses,  no organomegaly and incision: clean, dry, and intact Extremities: extremities normal, atraumatic, no cyanosis or edema and Homans sign is negative, no sign of DVT Vaginal Bleeding: minimal  Assessment: s/p Procedure(s): XI ROBOTIC ASSISTED LAPAROSCOPIC HYSTERECTOMY AND SALPINGECTOMY  (Bilateral): stable, progressing well, and tolerating diet  Plan: Advance diet Discontinue IV fluids Discharge home  LOS: 0 days    Sharen Youngren J 09/15/2021, 6:59 AM

## 2021-09-18 ENCOUNTER — Encounter (HOSPITAL_BASED_OUTPATIENT_CLINIC_OR_DEPARTMENT_OTHER): Payer: Self-pay | Admitting: Obstetrics and Gynecology

## 2021-09-18 LAB — SURGICAL PATHOLOGY

## 2022-06-22 ENCOUNTER — Ambulatory Visit (INDEPENDENT_AMBULATORY_CARE_PROVIDER_SITE_OTHER): Payer: Commercial Managed Care - PPO

## 2022-06-22 ENCOUNTER — Ambulatory Visit
Admission: RE | Admit: 2022-06-22 | Discharge: 2022-06-22 | Disposition: A | Discharge status: Home / Self Care | Payer: Commercial Managed Care - PPO | Source: Ambulatory Visit | Attending: Nurse Practitioner | Admitting: Nurse Practitioner

## 2022-06-22 VITALS — BP 136/75 | HR 71 | Temp 98.3°F | Resp 20

## 2022-06-22 DIAGNOSIS — Y9352 Activity, horseback riding: Secondary | ICD-10-CM | POA: Diagnosis not present

## 2022-06-22 DIAGNOSIS — S93401A Sprain of unspecified ligament of right ankle, initial encounter: Secondary | ICD-10-CM

## 2022-06-22 DIAGNOSIS — M25571 Pain in right ankle and joints of right foot: Secondary | ICD-10-CM

## 2022-06-22 NOTE — ED Triage Notes (Signed)
Pt reports was riding a horse and foot got caught in the stirrup and twisted last Saturday. Reports swollen and bruised.Over ankle bone it hurt to touch.Took ibuprofen and ice which provides some relief.   Wears an ankle brace

## 2022-06-22 NOTE — ED Provider Notes (Signed)
[facility]    CSN: [id number] Arrival date & time: 06/22/22  1318      History   Chief Complaint Chief Complaint  Patient presents with   Ankle Pain    Ankle injury last Saturday with bruising & swelling. Swelling is much better but still has sharp pain & limited mobility. - Entered by patient    HPI Lisa Wolf is a 41 y.o. female.   Patient presents for right ankle pain that has been present for about 6 days.  Reports that she was riding her horse on Saturday when they came across unlevel ground in her right foot twisted under itself in the stirrup.  Reports no fall or accident, however her foot and ankle were very painful, swollen, and bruised after this occurred.  She denies redness, numbness or tingling in her toes, or fevers, nausea/vomiting.  Reports she is also injured her ankle a different time and it was never quite right.  She has been using an ankle brace, elevating the ankle, using ice, and taking ibuprofen as needed for pain.  She is able to bear weight, however uneven ground makes the pain worse.    Past Medical History:  Diagnosis Date   Arthritis    oa knees   Concussion    as teenager no deficits from   [place] 09/20/2019   loss of taste and smell headache x 2 3 days all symptoms resolved   GERD (gastroesophageal reflux disease)    History of pregnancy induced hypertension 2013   Hypothyroidism    Migraine    occ   Neuromuscular disorder ([place])    2 pinched discs lower back   Uterine fibroid    Vitamin D deficiency     Patient Active Problem List   Diagnosis Date Noted   Uterine fibroid 09/14/2021   Fibroid 09/14/2021   Acute appendicitis 09/08/2015   SCIATICA 08/29/2009    Past Surgical History:  Procedure Laterality Date   CESAREAN SECTION  06/24/2012   Procedure: CESAREAN SECTION;  Surgeon: Lovenia Kim, MD;  Location: [facility];  Service: Obstetrics;  Laterality: N/A;   CHOLECYSTECTOMY  05/2019   laparoscopic   DILATATION &  CURETTAGE/HYSTEROSCOPY WITH MYOSURE N/A 06/16/2020   Procedure: DILATATION & CURETTAGE/HYSTEROSCOPY WITH MYOSURE;  Surgeon: Brien Few, MD;  Location: [place];  Service: Gynecology;  Laterality: N/A;   LAPAROSCOPIC APPENDECTOMY N/A 09/08/2015   Procedure: APPENDECTOMY LAPAROSCOPIC;  Surgeon: Aviva Signs, MD;  Location: [facility];  Service: General;  Laterality: N/A;   ROBOTIC ASSISTED LAPAROSCOPIC HYSTERECTOMY AND SALPINGECTOMY Bilateral 09/14/2021   Procedure: XI ROBOTIC ASSISTED LAPAROSCOPIC HYSTERECTOMY AND SALPINGECTOMY;  Surgeon: Brien Few, MD;  Location: [place];  Service: Gynecology;  Laterality: Bilateral;   WISDOM TOOTH EXTRACTION     yrs ago per pt on 09-07-2021    OB History     Gravida  1   Para  1   Term  1   Preterm  0   AB  0   Living  1      SAB  0   IAB  0   Ectopic  0   Multiple  0   Live Births  1            Home Medications    Prior to Admission medications   Medication Sig Start Date End Date Taking? Authorizing Provider  aspirin-acetaminophen-caffeine (EXCEDRIN MIGRAINE) [phone] MG tablet Take by mouth every 6 (six) hours as needed for headache.  [provider]  EUTHYROX 137 MCG tablet Take 137 mcg by mouth daily. 10/06/20   [provider]  famotidine (PEPCID) 20 MG tablet Take 20 mg by mouth as needed for heartburn or indigestion.    [provider]  gabapentin (NEURONTIN) 100 MG capsule Take 1 capsule (100 mg total) by mouth 3 (three) times daily. Patient taking differently: Take 100 mg by mouth as needed. 12/29/20   Carole Civil, MD  oxyCODONE-acetaminophen (PERCOCET/ROXICET) 5-325 MG tablet Take 1-2 tablets by mouth every 4 (four) hours as needed for severe pain. 09/15/21   Brien Few, MD  VITAMIN D PO Take 500 Units by mouth daily.    [provider]  TRI-SPRINTEC 0.18/0.215/0.25 MG-35 MCG tablet Take 1 tablet by mouth daily. 07/03/15 12/03/19   [provider]    Family History Family History  Problem Relation Age of Onset   Asthma Mother    Healthy Father     Social History Social History   Tobacco Use   Smoking status: Every Day    Packs/day: 0.50    Years: 20.00    Total pack years: 10.00    Types: Cigarettes   Smokeless tobacco: Never  Vaping Use   Vaping Use: Never used  Substance Use Topics   Alcohol use: No   Drug use: Yes    Comment: occ     Allergies   Ciprofloxacin   Review of Systems Review of Systems Per HPI  Physical Exam Triage Vital Signs ED Triage Vitals  Enc Vitals Group     BP 06/22/22 1331 136/75     Pulse Rate 06/22/22 1331 71     Resp 06/22/22 1331 20     Temp 06/22/22 1331 98.3 F (36.8 C)     Temp Source 06/22/22 1331 Oral     SpO2 06/22/22 1331 98 %     Weight --      Height --      Head Circumference --      Peak Flow --      Pain Score 06/22/22 1336 3     Pain Loc --      Pain Edu? --      Excl. in [place]? --    No data found.  Updated Vital Signs BP 136/75 (BP Location: Right Arm)   Pulse 71   Temp 98.3 F (36.8 C) (Oral)   Resp 20   LMP 12/26/2020   SpO2 98%   Visual Acuity Right Eye Distance:   Left Eye Distance:   Bilateral Distance:    Right Eye Near:   Left Eye Near:    Bilateral Near:     Physical Exam Vitals and nursing note reviewed.  Constitutional:      General: She is not in acute distress.    Appearance: Normal appearance. She is not toxic-appearing.  HENT:     Mouth/Throat:     Mouth: Mucous membranes are moist.     Pharynx: Oropharynx is clear.  Pulmonary:     Effort: Pulmonary effort is normal. No respiratory distress.  Musculoskeletal:     Right ankle: Swelling and ecchymosis present. No deformity. Tenderness present over the lateral malleolus, ATF ligament, AITF ligament and CF ligament. No base of 5th metatarsal or proximal fibula tenderness. Normal range of motion. Normal pulse.     Right foot: Normal. Normal range of  motion and normal capillary refill. No swelling, tenderness or bony tenderness. Normal pulse.       Feet:  Comments: Inspection: moderate swelling to the right lateral malleolus with bruising to the outer sole of the foot.  No obvious deformity, redness.  Palpation: Right lateral malleolus tender to palpation diffusely; no obvious deformities palpated ROM: Full ROM to right ankle, flexibility of foot Strength: 5/5 right lower extremity Neurovascular: neurovascularly intact in right lower extremity   Skin:    General: Skin is warm and dry.     Capillary Refill: Capillary refill takes less than 2 seconds.     Coloration: Skin is not jaundiced or pale.     Findings: No erythema.  Neurological:     Mental Status: She is alert and oriented to person, place, and time.  Psychiatric:        Behavior: Behavior is cooperative.      UC Treatments / Results  Labs (all labs ordered are listed, but only abnormal results are displayed) Labs Reviewed - No data to display  EKG   Radiology DG Ankle Complete Right  Result Date: 06/22/2022 CLINICAL DATA:  Twisted riding a horse. Swelling and bruising. Ankle got caught and sagittal STIR 5 days ago. Lateral ankle pain. EXAM: RIGHT ANKLE - COMPLETE 3+ VIEW COMPARISON:  None available FINDINGS: Moderate lateral malleolar soft tissue swelling. No acute fracture line is seen. The ankle mortise remains symmetric and intact. No dislocation. Joint spaces are preserved. IMPRESSION: Moderate lateral malleolar soft tissue swelling. No acute fracture is seen. Electronically Signed   By: Yvonne Kendall M.D.   On: 06/22/2022 13:55    Procedures Procedures (including critical care time)  Medications Ordered in UC Medications - No data to display  Initial Impression / Assessment and Plan / UC Course  I have reviewed the triage vital signs and the nursing notes.  Pertinent labs & imaging results that were available during my care of the patient were reviewed  by me and considered in my medical decision making (see chart for details).   Patient is well-appearing, normotensive, afebrile, not tachycardic, not tachypneic, oxygenating well on room air.    Sprain of right ankle, unspecified ligament, initial encounter Ankle x-ray today shows no acute fracture; there is moderate lateral malleolar soft tissue swelling Suspect right ankle sprain Patient has a lace up ankle brace, encouraged use of Ace wrap that she already has at home for compression Continue rest, elevation, compression, Tylenol/ibuprofen as needed for pain, and ice Discussed follow-up with podiatrist with no full improvement of symptoms for further evaluation Briefly discussed rehabilitation exercises and handout given   The patient was given the opportunity to ask questions.  All questions answered to their satisfaction.  The patient is in agreement to this plan.    Final Clinical Impressions(s) / UC Diagnoses   Final diagnoses:  Sprain of right ankle, unspecified ligament, initial encounter     Discharge Instructions      The x-ray of your ankle today does not show any fractured or broken bones  I suspect you have a ankle sprain; I have attached a couple of rehabilitation exercises you can start once the swelling goes down  Continue rest, elevation, ice, compression, and Tylenol/ibuprofen as needed for pain    ED Prescriptions   None    PDMP not reviewed this encounter.   Eulogio Bear, NP 06/22/22 1445

## 2022-06-22 NOTE — Discharge Instructions (Signed)
The x-ray of your ankle today does not show any fractured or broken bones  I suspect you have a ankle sprain; I have attached a couple of rehabilitation exercises you can start once the swelling goes down  Continue rest, elevation, ice, compression, and Tylenol/ibuprofen as needed for pain

## 2022-10-25 ENCOUNTER — Encounter: Payer: Self-pay | Admitting: Radiology

## 2023-05-31 ENCOUNTER — Emergency Department (HOSPITAL_COMMUNITY)
Admission: EM | Admit: 2023-05-31 | Discharge: 2023-05-31 | Disposition: A | Discharge status: Home / Self Care | Payer: Commercial Managed Care - PPO | Attending: Emergency Medicine | Admitting: Emergency Medicine

## 2023-05-31 ENCOUNTER — Other Ambulatory Visit: Payer: Self-pay

## 2023-05-31 ENCOUNTER — Emergency Department (HOSPITAL_COMMUNITY): Payer: Commercial Managed Care - PPO

## 2023-05-31 ENCOUNTER — Encounter (HOSPITAL_COMMUNITY): Payer: Self-pay | Admitting: *Deleted

## 2023-05-31 DIAGNOSIS — R079 Chest pain, unspecified: Secondary | ICD-10-CM | POA: Diagnosis present

## 2023-05-31 DIAGNOSIS — R42 Dizziness and giddiness: Secondary | ICD-10-CM | POA: Diagnosis not present

## 2023-05-31 DIAGNOSIS — R0789 Other chest pain: Secondary | ICD-10-CM | POA: Diagnosis not present

## 2023-05-31 DIAGNOSIS — F17200 Nicotine dependence, unspecified, uncomplicated: Secondary | ICD-10-CM | POA: Insufficient documentation

## 2023-05-31 DIAGNOSIS — R0602 Shortness of breath: Secondary | ICD-10-CM | POA: Diagnosis not present

## 2023-05-31 LAB — BASIC METABOLIC PANEL
Anion gap: 5 (ref 5–15)
BUN: 11 mg/dL (ref 6–20)
CO2: 24 mmol/L (ref 22–32)
Calcium: 8.7 mg/dL — ABNORMAL LOW (ref 8.9–10.3)
Chloride: 106 mmol/L (ref 98–111)
Creatinine, Ser: 0.71 mg/dL (ref 0.44–1.00)
GFR, Estimated: >60 mL/min (ref >60)
Glucose, Bld: 94 mg/dL (ref 70–99)
Potassium: 4 mmol/L (ref 3.5–5.1)
Sodium: 135 mmol/L (ref 135–145)

## 2023-05-31 LAB — CBC
HCT: 38.1 % (ref 36.0–46.0)
Hemoglobin: 13.1 g/dL (ref 12.0–15.0)
MCH: 31.1 pg (ref 26.0–34.0)
MCHC: 34.4 g/dL (ref 30.0–36.0)
MCV: 90.5 fL (ref 80.0–100.0)
Platelets: 319 10*3/uL (ref 150–400)
RBC: 4.21 MIL/uL (ref 3.87–5.11)
RDW: 12.2 % (ref 11.5–15.5)
WBC: 6 10*3/uL (ref 4.0–10.5)
nRBC: 0 % (ref 0.0–0.2)

## 2023-05-31 LAB — TROPONIN I (HIGH SENSITIVITY)
Troponin I (High Sensitivity): <2 ng/L (ref <18)
Troponin I (High Sensitivity): <2 ng/L (ref <18)

## 2023-05-31 NOTE — ED Triage Notes (Signed)
Pt with chest tightness/pressure since Wednesday night across chest. + HA + palpitations at times with lightheadedness. Pt with thyroid issues and has not had medication for it for the past month.

## 2023-05-31 NOTE — Discharge Instructions (Addendum)
We saw you in the ER for the chest pain/shortness of breath. All of our cardiac workup is normal, including labs, EKG and chest X-RAY are normal. We are not sure what is causing your discomfort, but we feel comfortable sending you home at this time. The workup in the ER is not complete, and you should follow up with Cardiologist. We have put in a referral to Cardiologist.   Please return to the ER if you have worsening chest pain, shortness of breath, pain radiating to your jaw, shoulder, or back, sweats or fainting. Otherwise see the Cardiologist or your primary care doctor as requested.

## 2023-05-31 NOTE — ED Provider Notes (Signed)
[place] EMERGENCY DEPARTMENT AT [facility] Provider Note   CSN: [id number] Arrival date & time: 05/31/23  1126     History  Chief Complaint  Patient presents with   Chest Pain    Lisa Wolf is a 42 y.o. female.  HPI    42 year old female comes in with chief complaint of chest pain.  Patient has history of tobacco use disorder, with 20+ pack year smoking history.  She indicates that she started noticing some chest pain intermittently this week.  She will have about 5-10 episodes a day for the last 2 days.  The discomfort is described as chest pressure that is generalized.  Pain is not typically provoked with any exertion and there is no specific aggravating or relieving factors.  She has had associated " breath taken away feeling" which will last just for few seconds and and dizziness.  Pt has no hx of PE, DVT and denies any exogenous hormone (testosterone / estrogen) use, long distance travels or surgery in the past 6 weeks, active cancer, recent immobilization.]  There is no family history of premature CAD.  She went to a nurse practitioner at work, they sent her to the ER because of questionable irregular heartbeat.  Patient indicates that she has had off-and-on palpitations.  Home Medications Prior to Admission medications   Medication Sig Start Date End Date Taking? Authorizing Provider  aspirin-acetaminophen-caffeine (EXCEDRIN MIGRAINE) [phone] MG tablet Take by mouth every 6 (six) hours as needed for headache.    [provider]  EUTHYROX 137 MCG tablet Take 137 mcg by mouth daily. 10/06/20   [provider]  famotidine (PEPCID) 20 MG tablet Take 20 mg by mouth as needed for heartburn or indigestion.    [provider]  gabapentin (NEURONTIN) 100 MG capsule Take 1 capsule (100 mg total) by mouth 3 (three) times daily. Patient taking differently: Take 100 mg by mouth as needed. 12/29/20   Vickki Hearing, MD   oxyCODONE-acetaminophen (PERCOCET/ROXICET) 5-325 MG tablet Take 1-2 tablets by mouth every 4 (four) hours as needed for severe pain. 09/15/21   Olivia Mackie, MD  VITAMIN D PO Take 500 Units by mouth daily.    [provider]  TRI-SPRINTEC 0.18/0.215/0.25 MG-35 MCG tablet Take 1 tablet by mouth daily. 07/03/15 12/03/19  [provider]      Allergies    Ciprofloxacin    Review of Systems   Review of Systems  All other systems reviewed and are negative.   Physical Exam Updated Vital Signs BP 124/69   Pulse 68   Temp 98.2 F (36.8 C) (Temporal)   Resp 19   Ht 5\' 6"  (1.676 m)   Wt 93 kg   LMP 12/26/2020   SpO2 98%   BMI 33.09 kg/m  Physical Exam Vitals and nursing note reviewed.  Constitutional:      Appearance: She is well-developed.  HENT:     Head: Normocephalic and atraumatic.  Eyes:     Extraocular Movements: Extraocular movements intact.  Cardiovascular:     Rate and Rhythm: Normal rate.  Pulmonary:     Effort: Pulmonary effort is normal.  Musculoskeletal:     Cervical back: Normal range of motion and neck supple.     Right lower leg: No tenderness. No edema.     Left lower leg: No tenderness. No edema.  Skin:    General: Skin is dry.  Neurological:     Mental Status: She is alert and oriented  to person, place, and time.     ED Results / Procedures / Treatments   Labs (all labs ordered are listed, but only abnormal results are displayed) Labs Reviewed  BASIC METABOLIC PANEL - Abnormal; Notable for the following components:      Result Value   Calcium 8.7 (*)    All other components within normal limits  CBC  TROPONIN I (HIGH SENSITIVITY)  TROPONIN I (HIGH SENSITIVITY)    EKG EKG Interpretation Date/Time:  Friday May 31 2023 11:40:59 EDT Ventricular Rate:  75 PR Interval:  124 QRS Duration:  76 QT Interval:  396 QTC Calculation: 442 R Axis:   69  Text Interpretation: Normal sinus rhythm Normal ECG When compared with ECG  of 23-Jul-2015 21:13, No significant change was found No acute changes No significant change since last tracing Confirmed by Derwood Kaplan [phone]) on 05/31/2023 12:00:50 PM  Radiology DG Chest 2 View  Result Date: 05/31/2023 CLINICAL DATA:  CP EXAM: CHEST - 2 VIEW COMPARISON:  2018 FINDINGS: The cardiomediastinal silhouette is within normal limits. No pleural effusion. No pneumothorax. No mass or consolidation. No acute osseous abnormality. IMPRESSION: No acute findings in the chest. Electronically Signed   By: Olive Bass M.D.   On: 05/31/2023 13:44    Procedures Procedures    Medications Ordered in ED Medications - No data to display  ED Course/ Medical Decision Making/ A&P             HEART Score: 2                    Medical Decision Making Amount and/or Complexity of Data Reviewed Labs: ordered. Radiology: ordered.   This patient presents to the ED with chief complaint(s) of intermittent chest pressure, palpitations with associated shortness of breath, dizziness on few occasions with pertinent past medical history of tobacco use disorder.The complaint involves an extensive differential diagnosis and also carries with it a high risk of complications and morbidity.    The differential diagnosis considered for this patient includes  ACS syndrome Aortic dissection Valvular disorder Pneumonia Pleural effusion / Pulmonary edema PE Pneumothorax Musculoskeletal pain PUD / Gastritis / Esophagitis Esophageal spasm PSVT/PAC/PVC/A-fib  The initial plan is to get basic labs. Patient has well score of 0 and she is PERC negative.  I do not think we need to pursue PE workup in her case as we have extremely low pretest probability of PE in first place.  Plan is to get delta tropes, if reassuring patient can be discharged with outpatient cardiology follow-up.   Independent labs interpretation:  The following labs were independently interpreted: Troponins are  undetectable.  Independent visualization and interpretation of imaging: - I independently visualized the following imaging with scope of interpretation limited to determining acute life threatening conditions related to emergency care: X-ray of the chest, which revealed no evidence of pneumothorax  Treatment and Reassessment: The patient appears reasonably screened and/or stabilized for discharge and I doubt any other medical condition or other [facility] requiring further screening, evaluation, or treatment in the ED at this time prior to discharge.   Results from the ER workup discussed with the patient face to face and all questions answered to the best of my ability. The patient is safe for discharge with strict return precautions.  Final Clinical Impression(s) / ED Diagnoses Final diagnoses:  Sensation of chest pressure    Rx / DC Orders ED Discharge Orders          Ordered  Ambulatory referral to Cardiology       Comments: If you have not heard from the Cardiology office within the next 72 hours please call [phone].   05/31/23 1520              Derwood Kaplan, MD 05/31/23 [phone]
